# Patient Record
Sex: Female | Born: 1995
Health system: Southern US, Community
[De-identification: ages and names within clinical notes are randomized; demographics above are authoritative.]

## PROBLEM LIST (undated history)

## (undated) DIAGNOSIS — F32A Depression, unspecified: Secondary | ICD-10-CM

## (undated) DIAGNOSIS — F419 Anxiety disorder, unspecified: Secondary | ICD-10-CM

## (undated) DIAGNOSIS — A6000 Herpesviral infection of urogenital system, unspecified: Secondary | ICD-10-CM

## (undated) DIAGNOSIS — F329 Major depressive disorder, single episode, unspecified: Secondary | ICD-10-CM

## (undated) HISTORY — DX: Anxiety disorder, unspecified: F41.9

## (undated) HISTORY — DX: Depression, unspecified: F32.A

## (undated) HISTORY — DX: Herpesviral infection of urogenital system, unspecified: A60.00

## (undated) HISTORY — DX: Major depressive disorder, single episode, unspecified: F32.9

---
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
## 2015-05-04 ENCOUNTER — Encounter (HOSPITAL_COMMUNITY): Payer: Self-pay | Admitting: Emergency Medicine

## 2015-05-04 ENCOUNTER — Emergency Department (HOSPITAL_COMMUNITY)
Admission: EM | Admit: 2015-05-04 | Discharge: 2015-05-04 | Disposition: A | Discharge status: Home / Self Care | Payer: Commercial Managed Care - PPO | Attending: Emergency Medicine | Admitting: Emergency Medicine

## 2015-05-04 DIAGNOSIS — S0993XA Unspecified injury of face, initial encounter: Secondary | ICD-10-CM

## 2015-05-04 DIAGNOSIS — Y939 Activity, unspecified: Secondary | ICD-10-CM | POA: Insufficient documentation

## 2015-05-04 DIAGNOSIS — Z87891 Personal history of nicotine dependence: Secondary | ICD-10-CM | POA: Diagnosis not present

## 2015-05-04 DIAGNOSIS — Y999 Unspecified external cause status: Secondary | ICD-10-CM | POA: Insufficient documentation

## 2015-05-04 DIAGNOSIS — J45909 Unspecified asthma, uncomplicated: Secondary | ICD-10-CM | POA: Diagnosis not present

## 2015-05-04 DIAGNOSIS — F329 Major depressive disorder, single episode, unspecified: Secondary | ICD-10-CM | POA: Diagnosis not present

## 2015-05-04 DIAGNOSIS — Z79899 Other long term (current) drug therapy: Secondary | ICD-10-CM | POA: Insufficient documentation

## 2015-05-04 DIAGNOSIS — Y929 Unspecified place or not applicable: Secondary | ICD-10-CM | POA: Insufficient documentation

## 2015-05-04 MED ORDER — MORPHINE SULFATE (PF) 4 MG/ML IV SOLN
4.0000 mg | Freq: Once | INTRAVENOUS | Status: AC
  Administered 2015-05-04: 4 mg via INTRAVENOUS
  Filled 2015-05-04: qty 1

## 2015-05-04 MED ORDER — ONDANSETRON HCL 4 MG/2ML IJ SOLN
4.0000 mg | Freq: Once | INTRAMUSCULAR | Status: AC
  Administered 2015-05-04: 4 mg via INTRAVENOUS
  Filled 2015-05-04: qty 2

## 2015-05-04 MED ORDER — LORAZEPAM 2 MG/ML IJ SOLN
1.0000 mg | Freq: Once | INTRAMUSCULAR | Status: AC
  Administered 2015-05-04: 1 mg via INTRAVENOUS
  Filled 2015-05-04: qty 1

## 2015-05-04 MED ORDER — LIDOCAINE HCL (PF) 1 % IJ SOLN
INTRAMUSCULAR | Status: AC
  Administered 2015-05-04: 06:00:00
  Filled 2015-05-04: qty 5

## 2015-05-04 MED ORDER — CLINDAMYCIN HCL 300 MG PO CAPS: 300.0000 mg | ORAL_CAPSULE | Freq: Four times a day (QID) | ORAL | Status: DC

## 2015-05-04 MED ORDER — OXYCODONE-ACETAMINOPHEN 5-325 MG PO TABS
1.0000 | ORAL_TABLET | Freq: Three times a day (TID) | ORAL | Status: DC | PRN
Start: 2015-05-04 — End: 2023-08-02
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
## 2017-04-14 ENCOUNTER — Encounter (HOSPITAL_COMMUNITY): Payer: Self-pay | Admitting: Licensed Clinical Social Worker

## 2017-04-15 ENCOUNTER — Encounter (HOSPITAL_COMMUNITY): Payer: Self-pay | Admitting: Psychiatry

## 2017-04-15 ENCOUNTER — Ambulatory Visit (HOSPITAL_COMMUNITY): Payer: Commercial Managed Care - PPO | Admitting: Psychiatry

## 2017-04-15 DIAGNOSIS — F411 Generalized anxiety disorder: Secondary | ICD-10-CM

## 2017-04-15 DIAGNOSIS — F331 Major depressive disorder, recurrent, moderate: Secondary | ICD-10-CM
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
## 2017-05-04 ENCOUNTER — Ambulatory Visit (INDEPENDENT_AMBULATORY_CARE_PROVIDER_SITE_OTHER): Payer: Commercial Managed Care - PPO | Admitting: Licensed Clinical Social Worker

## 2017-05-04 ENCOUNTER — Encounter

## 2017-05-04 DIAGNOSIS — F411 Generalized anxiety disorder: Secondary | ICD-10-CM | POA: Diagnosis not present

## 2017-05-04 DIAGNOSIS — F1021 Alcohol dependence, in remission: Secondary | ICD-10-CM

## 2017-05-04 DIAGNOSIS — Z634 Disappearance and death of family member: Secondary | ICD-10-CM | POA: Diagnosis not present

## 2017-05-04 NOTE — Progress Notes (Signed)
   THERAPIST PROGRESS NOTE  Session Time: 4-5pm  Participation Level: Active  Behavioral Response: Bizarre and Well GroomedAlert and ConfusedEuthymic  Type of Therapy: Individual Therapy  Treatment Goals addressed: Anxiety  Interventions: CBT and Motivational Interviewing  Summary: Christie Smith is a 22 y.o. female who presents with panic sxs at work, GAD, and hx of AUD w/ 9 mo sobriety. She is engaged in session though she presents as somewhat detached, soft spoken, and silly. She reveals 15 min into session that her sister died suddenly on 05/02/2022 from complications due to alcohol and/or drugs. Pt states she intends to get help w/ controlling her panic sxs while at work. 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 When asked whether pt wants to make another appointment, pt hesitates and states she needs to "find out some more information about her insurance".   "I get really drained and overwhelmed by crowds. My thoughts go all over the place and it gets hard to breathe." "My sister just died a few weeks ago, of an apparent overdose." "My boss wants me to talk to therapist about whether or not I can do my job." "I've always had a lot of anxiety; I use to whisper to my mother and she would speak to others for me as a kid." "I laugh when I get nervous."   Suicidal/Homicidal: Nowithout intent/plan  Therapist Response: Counselor used open questions, reflection of emotion and content, supportive feedback, validation, and empathy. Counselor used minimal encourages to promote pt self disclosure though pt stated "she was not yet comfortable enough to go into her past trauma.   Plan: Return again TBD by pt.  Diagnosis:    ICD-10-CM   1. Generalized anxiety disorder F41.1   2. Alcohol use disorder, severe, in early remission [facility]) F10.21        Margo Common, LCAS-A 05/05/2017

## 2017-05-05 ENCOUNTER — Encounter (HOSPITAL_COMMUNITY): Payer: Self-pay | Admitting: Licensed Clinical Social Worker

## 2017-11-08 DIAGNOSIS — Z91018 Allergy to other foods: Secondary | ICD-10-CM | POA: Diagnosis not present

## 2017-11-08 DIAGNOSIS — Z124 Encounter for screening for malignant neoplasm of cervix: Secondary | ICD-10-CM | POA: Diagnosis not present

## 2017-11-08 DIAGNOSIS — Z0000 Encounter for general adult medical examination without abnormal findings: Secondary | ICD-10-CM | POA: Diagnosis not present

## 2017-11-08 DIAGNOSIS — Z13220 Encounter for screening for lipoid disorders: Secondary | ICD-10-CM | POA: Diagnosis not present

## 2018-02-08 ENCOUNTER — Ambulatory Visit: Payer: Commercial Managed Care - PPO | Admitting: Allergy & Immunology

## 2020-06-10 ENCOUNTER — Emergency Department (HOSPITAL_COMMUNITY): Payer: Managed Care, Other (non HMO)

## 2020-06-10 ENCOUNTER — Emergency Department (HOSPITAL_COMMUNITY)
Admission: EM | Admit: 2020-06-10 | Discharge: 2020-06-11 | Disposition: A | Discharge status: Home / Self Care | Payer: Managed Care, Other (non HMO) | Attending: Emergency Medicine | Admitting: Emergency Medicine

## 2020-06-10 ENCOUNTER — Ambulatory Visit (HOSPITAL_COMMUNITY)
Admission: EM | Admit: 2020-06-10 | Discharge: 2020-06-10 | Disposition: A | Discharge status: Home / Self Care | Payer: Managed Care, Other (non HMO)

## 2020-06-10 ENCOUNTER — Inpatient Hospital Stay (EMERGENCY_DEPARTMENT_HOSPITAL)
Admission: AD | Admit: 2020-06-10 | Discharge: 2020-06-10 | Disposition: A | Discharge status: Home / Self Care | Payer: Managed Care, Other (non HMO) | Source: Home / Self Care | Attending: Obstetrics and Gynecology | Admitting: Obstetrics and Gynecology

## 2020-06-10 ENCOUNTER — Encounter (HOSPITAL_COMMUNITY): Payer: Self-pay | Admitting: Emergency Medicine

## 2020-06-10 ENCOUNTER — Other Ambulatory Visit: Payer: Self-pay

## 2020-06-10 ENCOUNTER — Encounter (HOSPITAL_COMMUNITY): Payer: Self-pay

## 2020-06-10 DIAGNOSIS — R1031 Right lower quadrant pain: Secondary | ICD-10-CM | POA: Diagnosis not present

## 2020-06-10 DIAGNOSIS — J45909 Unspecified asthma, uncomplicated: Secondary | ICD-10-CM | POA: Diagnosis not present

## 2020-06-10 DIAGNOSIS — R110 Nausea: Secondary | ICD-10-CM | POA: Diagnosis not present

## 2020-06-10 DIAGNOSIS — N939 Abnormal uterine and vaginal bleeding, unspecified: Secondary | ICD-10-CM | POA: Diagnosis not present

## 2020-06-10 DIAGNOSIS — R1011 Right upper quadrant pain: Secondary | ICD-10-CM | POA: Insufficient documentation

## 2020-06-10 DIAGNOSIS — D72829 Elevated white blood cell count, unspecified: Secondary | ICD-10-CM | POA: Diagnosis not present

## 2020-06-10 DIAGNOSIS — Z3202 Encounter for pregnancy test, result negative: Secondary | ICD-10-CM | POA: Insufficient documentation

## 2020-06-10 DIAGNOSIS — Z87891 Personal history of nicotine dependence: Secondary | ICD-10-CM | POA: Diagnosis not present

## 2020-06-10 DIAGNOSIS — Z789 Other specified health status: Secondary | ICD-10-CM

## 2020-06-10 LAB — COMPREHENSIVE METABOLIC PANEL
ALT: 19 U/L (ref 0–44)
AST: 26 U/L (ref 15–41)
Albumin: 4.4 g/dL (ref 3.5–5.0)
Alkaline Phosphatase: 79 U/L (ref 38–126)
Anion gap: 11 (ref 5–15)
BUN: 9 mg/dL (ref 6–20)
CO2: 24 mmol/L (ref 22–32)
Calcium: 9.8 mg/dL (ref 8.9–10.3)
Chloride: 103 mmol/L (ref 98–111)
Creatinine, Ser: 0.9 mg/dL (ref 0.44–1.00)
GFR, Estimated: >60 mL/min (ref >60)
Glucose, Bld: 120 mg/dL — ABNORMAL HIGH (ref 70–99)
Potassium: 3.6 mmol/L (ref 3.5–5.1)
Sodium: 138 mmol/L (ref 135–145)
Total Bilirubin: 0.2 mg/dL — ABNORMAL LOW (ref 0.3–1.2)
Total Protein: 7.9 g/dL (ref 6.5–8.1)

## 2020-06-10 LAB — CBC WITH DIFFERENTIAL/PLATELET
Abs Immature Granulocytes: 0.05 10*3/uL (ref 0.00–0.07)
Basophils Absolute: 0 10*3/uL (ref 0.0–0.1)
Basophils Relative: 0 %
Eosinophils Absolute: 0 10*3/uL (ref 0.0–0.5)
Eosinophils Relative: 0 %
HCT: 42.6 % (ref 36.0–46.0)
Hemoglobin: 14.6 g/dL (ref 12.0–15.0)
Immature Granulocytes: 1 %
Lymphocytes Relative: 30 %
Lymphs Abs: 3.2 10*3/uL (ref 0.7–4.0)
MCH: 31.1 pg (ref 26.0–34.0)
MCHC: 34.3 g/dL (ref 30.0–36.0)
MCV: 90.8 fL (ref 80.0–100.0)
Monocytes Absolute: 0.7 10*3/uL (ref 0.1–1.0)
Monocytes Relative: 6 %
Neutro Abs: 6.8 10*3/uL (ref 1.7–7.7)
Neutrophils Relative %: 63 %
Platelets: 301 10*3/uL (ref 150–400)
RBC: 4.69 MIL/uL (ref 3.87–5.11)
RDW: 12.5 % (ref 11.5–15.5)
WBC: 10.8 10*3/uL — ABNORMAL HIGH (ref 4.0–10.5)
nRBC: 0 % (ref 0.0–0.2)

## 2020-06-10 LAB — URINALYSIS, ROUTINE W REFLEX MICROSCOPIC
Bilirubin Urine: NEGATIVE
Glucose, UA: NEGATIVE mg/dL
Hgb urine dipstick: NEGATIVE
Ketones, ur: NEGATIVE mg/dL
Leukocytes,Ua: NEGATIVE
Nitrite: NEGATIVE
Protein, ur: NEGATIVE mg/dL
Specific Gravity, Urine: 1.002 — ABNORMAL LOW (ref 1.005–1.030)
pH: 7 (ref 5.0–8.0)

## 2020-06-10 LAB — POCT PREGNANCY, URINE: Preg Test, Ur: NEGATIVE

## 2020-06-10 LAB — LIPASE, BLOOD: Lipase: 28 U/L (ref 11–51)

## 2020-06-10 NOTE — ED Provider Notes (Signed)
Emergency Medicine Provider Triage Evaluation Note  Christie Smith , a 25 y.o. female  was evaluated in triage.  Pt complains of ruq ABD PAIN x1 mo.  Review of Systems  Positive: RUQ pain Negative: Urinary sxs  Physical Exam  LMP 06/07/2020  Gen:   Awake, no distress   Resp:  Normal effort  MSK:   Moves extremities without difficulty  Other:  TTP RUQ  Medical Decision Making  Medically screening exam initiated at 5:15 PM.  Appropriate orders placed.  Christie Smith was informed that the remainder of the evaluation will be completed by another provider, this initial triage assessment does not replace that evaluation, and the importance of remaining in the ED until their evaluation is complete.  RUQ abd pain- work up started   Arthor Captain, PA-C 06/10/20 1721    Long, Arlyss Repress, MD 06/10/20 2014

## 2020-06-10 NOTE — MAU Provider Note (Signed)
     S Ms. Christie Smith is a 25 y.o. No obstetric history on file. non-pregnant female who presents to MAU today with complaint of vaginal bleeding. Period was about one week lat and she is now having vaginal bleeding. Did not take a pregnancy test at home but wondering if pregnant. UPT neg on arrival.   O BP (!) 144/93 (BP Location: Right Arm)   Pulse 96   Temp 99 F (37.2 C) (Oral)   Resp 16   SpO2 98% Comment: room air VS reviewed, nursing note reviewed,  Constitutional: well developed, well nourished, no distress HEENT: normocephalic CV: normal rate Pulm: normal effort Abdomen: soft, NT Neuro: alert and oriented x 3 Skin: warm, dry Psych: affect normal  A Non pregnant female Medical screening exam complete    P Discharge from MAU in stable condition Patient given the option of transfer to [facility] for further evaluation or seek care in outpatient facility of choice List of options for follow-up given  Warning signs for worsening condition that would warrant emergency follow-up discussed Patient may return to MAU as needed for pregnancy related complaints  Gita Kudo, MD 06/10/2020 2:33 PM

## 2020-06-10 NOTE — ED Notes (Signed)
Patient is being discharged from the Urgent Care and sent to the Emergency Department via POV . Per Salli Quarry, NP, patient is in need of higher level of care due to severe abdominal pain. Patient is aware and verbalizes understanding of plan of care.  Vitals:   06/10/20 1537  BP: 131/85  Pulse: 96  Resp: 19  Temp: 98.5 F (36.9 C)  SpO2: 100%

## 2020-06-10 NOTE — ED Triage Notes (Signed)
Pt is present today RLQ pain, constipation, and nasuea. Pt states that her pain started a month ago. Pt describes the pain as sharp.

## 2020-06-10 NOTE — ED Triage Notes (Signed)
Patient complains of right sided abdominal pain x 1 month, reports that the intensity changes, sent from MAU after negative pregnancy test. Patient reports nausea with same

## 2020-06-10 NOTE — MAU Note (Signed)
Christie Smith is a 25 y.o.here in MAU reporting: thinks she might be pregnant or miscarrying. Did not do a pregnancy test. Was supposed to start her period a little over a week ago but it didn't start. 2 days ago started having sharp mid abdominal pain and started bleeding and passing clots. States bleeding has slowed down but is still bright red. States it does not smell like period blood but like blood.   Onset of complaint: ongoing  Pain score: 4/10  Vitals:   06/10/20 1423  BP: (!) 144/93  Pulse: 96  Resp: 16  Temp: 99 F (37.2 C)  SpO2: 98%     Lab orders placed from triage: UPT

## 2020-06-10 NOTE — ED Provider Notes (Signed)
[facility]    CSN: [id number] Arrival date & time: 06/10/20  1440      History   Chief Complaint Chief Complaint  Patient presents with  . Abdominal Pain    HPI Christie Smith is a 25 y.o. female.   Patient presents with sharp RLQ abdominal pain, intermittent constipation LBM 06/08/20, nausea, fatigue, generalized weakness that began 1 month ago. Pain has been constant and persistent, worsened depending on sitting position. Initially thought this was muscle injury related to gym therefore she has altered fitness routine by incorporating more walking and staying hydrated with water. Menses was 1 week late this month, typically cycle is regular, began on the 6/4 instead of 5/29. Describes day 1 as heavy with lots of clots, bleeding has become lighter over the last two days. Went to [facility] today, pregnancy test negative. History of anxiety, taking Pristiq and Wellbutrin as prescribed. Had stress induced seizure in April,first seizure ever, was supposed to follow up with neurology but never went. Sister died 3 years ago in 05-12-22 and patient feels her mental health and then physical health began to decline around anniversary of death. Has been putting off being evaluated stating " I just don't want to die, that would be so hard on my family" . Denies fever, chills, chest pain, shortness of breath, diarrhea, visual changes, speech changes, memory loss, dizziness, lightheadedness.   Past Medical History:  Diagnosis Date  . Anxiety   . Asthma   . Depression     There are no problems to display for this patient.   History reviewed. No pertinent surgical history.  OB History   No obstetric history on file.      Home Medications    Prior to Admission medications   Medication Sig Start Date End Date Taking? Authorizing Provider  buPROPion (WELLBUTRIN XL) 150 MG 24 hr tablet Take 1 tablet by mouth every morning. 04/16/20   [provider]  clindamycin (CLEOCIN) 300 MG  capsule Take 1 capsule (300 mg total) by mouth 4 (four) times daily. X 7 days 05/04/15   Zadie Rhine, MD  desvenlafaxine (PRISTIQ) 100 MG 24 hr tablet Take 100 mg by mouth daily. 05/27/20   [provider]  naproxen sodium (ANAPROX) 220 MG tablet Take 440 mg by mouth 2 (two) times daily as needed (for pain.).    [provider]  oxyCODONE-acetaminophen (PERCOCET/ROXICET) 5-325 MG tablet Take 1 tablet by mouth every 8 (eight) hours as needed for severe pain. 05/04/15   Zadie Rhine, MD    Family History History reviewed. No pertinent family history.  Social History Social History   Tobacco Use  . Smoking status: Former Smoker    Quit date: 08/10/2010    Years since quitting: 9.8  Substance Use Topics  . Alcohol use: No  . Drug use: No     Allergies   Amoxicillin-pot clavulanate   Review of Systems Review of Systems   Physical Exam Triage Vital Signs ED Triage Vitals  Enc Vitals Group     BP 06/10/20 1537 131/85     Pulse Rate 06/10/20 1537 96     Resp 06/10/20 1537 19     Temp 06/10/20 1537 98.5 F (36.9 C)     Temp Source 06/10/20 1537 Oral     SpO2 06/10/20 1537 100 %     Weight --      Height --      Head Circumference --      Peak Flow --  Pain Score 06/10/20 1540 5     Pain Loc --      Pain Edu? --      Excl. in GC? --    No data found.  Updated Vital Signs BP 131/85 (BP Location: Right Arm)   Pulse 96   Temp 98.5 F (36.9 C) (Oral)   Resp 19   LMP 06/07/2020   SpO2 100%   Visual Acuity Right Eye Distance:   Left Eye Distance:   Bilateral Distance:    Right Eye Near:   Left Eye Near:    Bilateral Near:     Physical Exam   UC Treatments / Results  Labs (all labs ordered are listed, but only abnormal results are displayed) Labs Reviewed - No data to display  EKG   Radiology No results found.  Procedures Procedures (including critical care time)  Medications Ordered in UC Medications - No data to  display  Initial Impression / Assessment and Plan / UC Course  I have reviewed the triage vital signs and the nursing notes.  Pertinent labs & imaging results that were available during my care of the patient were reviewed by me and considered in my medical decision making (see chart for details).  RLQ abdominal pain  Sent to emergency department for further evaluation, possibly need higher lever of imaging, on exam palpation of RLQ elicited severe pain causing patient to jerk body, guard then became tearful. Vital signs stable at this time. To be escorted to emergency department by father.  Final Clinical Impressions(s) / UC Diagnoses   Final diagnoses:  None   Discharge Instructions   None    ED Prescriptions    None     PDMP not reviewed this encounter.   Valinda Hoar, [place] 06/10/20 [phone]

## 2020-06-11 MED ORDER — POLYETHYLENE GLYCOL 3350 17 G PO PACK: 17.0000 g | PACK | Freq: Every day | ORAL | 0 refills | Status: DC

## 2020-06-11 MED ORDER — OMEPRAZOLE 20 MG PO CPDR: 20.0000 mg | DELAYED_RELEASE_CAPSULE | Freq: Every day | ORAL | 0 refills | Status: DC

## 2020-06-11 NOTE — ED Provider Notes (Signed)
[facility] EMERGENCY DEPARTMENT Provider Note   CSN: [id number] Arrival date & time: 06/10/20  1609     History No chief complaint on file.   Christie Smith is a 25 y.o. female presents to the Emergency Department complaining of gradual, persistent right upper quadrant and right-sided abdominal pain onset approximately 1 month ago.  Patient reports it is worse with movement and worse after eating a large meal.  It is associated with nausea but she has not had any vomiting.  No treatments prior to arrival.  No previous abdominal surgeries.  Patient reports she was concerned that she might be pregnant and has had some vaginal bleeding with intercourse but recently missed her period.  No other specific aggravating or alleviating factors.  Deep breaths do not make this worse.  No chest pain or shortness of breath.  No recent travel or sick contacts.  No diarrhea.  Reports bowel movements have been hard over the last several weeks but she denies melena or hematochezia.  No treatment for constipation.  The history is provided by the patient and medical records. No language interpreter was used.       Past Medical History:  Diagnosis Date  . Anxiety   . Asthma   . Depression     There are no problems to display for this patient.   History reviewed. No pertinent surgical history.   OB History   No obstetric history on file.     No family history on file.  Social History   Tobacco Use  . Smoking status: Former Smoker    Quit date: 08/10/2010    Years since quitting: 9.8  Substance Use Topics  . Alcohol use: No  . Drug use: No    Home Medications Prior to Admission medications   Medication Sig Start Date End Date Taking? Authorizing Provider  buPROPion (WELLBUTRIN XL) 150 MG 24 hr tablet Take 1 tablet by mouth every morning. 04/16/20  Yes [provider]  desvenlafaxine (PRISTIQ) 100 MG 24 hr tablet Take 100 mg by mouth daily. 05/27/20  Yes [provider]  lamoTRIgine (LAMICTAL) 25 MG tablet Take 50 mg by mouth daily. 04/16/20  Yes [provider]  naproxen sodium (ANAPROX) 220 MG tablet Take 440 mg by mouth 2 (two) times daily as needed (for pain.).   Yes [provider]  clindamycin (CLEOCIN) 300 MG capsule Take 1 capsule (300 mg total) by mouth 4 (four) times daily. X 7 days Patient not taking: No sig reported 05/04/15   Zadie Rhine, MD  oxyCODONE-acetaminophen (PERCOCET/ROXICET) 5-325 MG tablet Take 1 tablet by mouth every 8 (eight) hours as needed for severe pain. Patient not taking: No sig reported 05/04/15   Zadie Rhine, MD    Allergies    Amoxicillin-pot clavulanate  Review of Systems   Review of Systems  Constitutional: Negative for appetite change, diaphoresis, fatigue, fever and unexpected weight change.  HENT: Negative for mouth sores and trouble swallowing.   Respiratory: Negative for cough, chest tightness, shortness of breath, wheezing and stridor.   Cardiovascular: Negative for chest pain and palpitations.  Gastrointestinal: Positive for abdominal pain and nausea. Negative for abdominal distention, blood in stool, constipation, diarrhea, rectal pain and vomiting.  Genitourinary: Negative for difficulty urinating, dysuria, flank pain, frequency, hematuria and urgency.  Musculoskeletal: Negative for back pain, neck pain and neck stiffness.  Skin: Negative for rash.  Neurological: Negative for weakness.  Hematological: Negative for adenopathy.  Psychiatric/Behavioral: Negative for confusion.  All other systems reviewed and are negative.   Physical Exam Updated Vital Signs BP 139/73 (BP Location: Right Arm)   Pulse 79   Temp 98.3 F (36.8 C) (Oral)   Resp 16   LMP 06/07/2020   SpO2 99%   Physical Exam Vitals and nursing note reviewed.  Constitutional:      General: She is not in acute distress.    Appearance: She is not diaphoretic.  HENT:     Head: Normocephalic.  Eyes:      General: No scleral icterus.    Conjunctiva/sclera: Conjunctivae normal.  Cardiovascular:     Rate and Rhythm: Normal rate and regular rhythm.     Pulses: Normal pulses.          Radial pulses are 2+ on the right side and 2+ on the left side.  Pulmonary:     Effort: No tachypnea, accessory muscle usage, prolonged expiration, respiratory distress or retractions.     Breath sounds: No stridor.     Comments: Equal chest rise. No increased work of breathing. Abdominal:     General: There is no distension.     Palpations: Abdomen is soft.     Tenderness: There is abdominal tenderness in the right upper quadrant. There is no right CVA tenderness, left CVA tenderness, guarding or rebound.    Musculoskeletal:     Cervical back: Normal range of motion.     Comments: Moves all extremities equally and without difficulty.  Skin:    General: Skin is warm and dry.     Capillary Refill: Capillary refill takes less than 2 seconds.  Neurological:     Mental Status: She is alert.     GCS: GCS eye subscore is 4. GCS verbal subscore is 5. GCS motor subscore is 6.     Comments: Speech is clear and goal oriented.  Psychiatric:        Mood and Affect: Mood normal.     ED Results / Procedures / Treatments   Labs (all labs ordered are listed, but only abnormal results are displayed) Labs Reviewed  CBC WITH DIFFERENTIAL/PLATELET - Abnormal; Notable for the following components:      Result Value   WBC 10.8 (*)    All other components within normal limits  COMPREHENSIVE METABOLIC PANEL - Abnormal; Notable for the following components:   Glucose, Bld 120 (*)    Total Bilirubin 0.2 (*)    All other components within normal limits  URINALYSIS, ROUTINE W REFLEX MICROSCOPIC - Abnormal; Notable for the following components:   Color, Urine COLORLESS (*)    Specific Gravity, Urine 1.002 (*)    All other components within normal limits  LIPASE, BLOOD  PREGNANCY, URINE    Radiology CT Abdomen  Pelvis Wo Contrast  Result Date: 06/10/2020 CLINICAL DATA:  Right lower quadrant abdominal pain EXAM: CT ABDOMEN AND PELVIS WITHOUT CONTRAST TECHNIQUE: Multidetector CT imaging of the abdomen and pelvis was performed following the standard protocol without IV contrast. COMPARISON:  None. FINDINGS: Lower chest: No acute abnormality. Hepatobiliary: No focal liver abnormality is seen. No gallstones, gallbladder wall thickening, or biliary dilatation. Pancreas: Unremarkable Spleen: Unremarkable Adrenals/Urinary Tract: Adrenal glands are unremarkable. Kidneys are normal, without renal calculi, focal lesion, or hydronephrosis. Bladder is unremarkable. Stomach/Bowel: Stomach is within normal limits. Appendix appears normal. No evidence of bowel wall thickening, distention, or inflammatory changes. No free intraperitoneal gas or fluid. Vascular/Lymphatic: No significant vascular findings are present. No enlarged abdominal or pelvic lymph nodes. Reproductive:  Uterus and bilateral adnexa are unremarkable. Other: No abdominal wall hernia.  Rectum unremarkable. Musculoskeletal: No acute bone abnormality. No lytic or blastic bone lesion. IMPRESSION: No acute intra-abdominal pathology identified.  Normal examination. Electronically Signed   By: Helyn Numbers MD   On: 06/10/2020 21:00   US ABDOMEN LIMITED RUQ (LIVER/GB)  Result Date: 06/10/2020 CLINICAL DATA:  Right upper quadrant pain x1 month EXAM: ULTRASOUND ABDOMEN LIMITED RIGHT UPPER QUADRANT COMPARISON:  None. FINDINGS: Gallbladder: No gallstones or wall thickening visualized. No sonographic Murphy sign noted by sonographer. Common bile duct: Diameter: 4 mm Liver: At the upper limits of normal for parenchymal echogenicity. No focal hepatic lesion is seen. Portal vein is patent on color Doppler imaging with normal direction of blood flow towards the liver. Other: None. IMPRESSION: Negative right upper quadrant ultrasound. Electronically Signed   By: Charline Bills  M.D.   On: 06/10/2020 18:07    Procedures Procedures   Medications Ordered in ED Medications - No data to display  ED Course  I have reviewed the triage vital signs and the nursing notes.  Pertinent labs & imaging results that were available during my care of the patient were reviewed by me and considered in my medical decision making (see chart for details).    MDM Rules/Calculators/A&P                           Patient presents with 1 month of right-sided abdominal pain.  Associated nausea but no vomiting.  No fevers or chills.  No recent travel.  Work-up here is reassuring.  Mild leukocytosis is noted but no signs of sirs or sepsis.  Afebrile.  Patient tolerating p.o. without difficulty.  CT scan without acute abnormality including no evidence of colitis, cholecystitis, diverticulitis, appendicitis.  Dedicated right upper quadrant ultrasound with normal gallbladder and no evidence of gallstones.  No elevation in lipase.  Discussed recent diet changes.  Possibility of gastritis.  Will start omeprazole.  Also suspect some element of constipation.  Will start MiraLAX.  Patient is to follow-up with GI as directed.  Of note, no pregnancy test was ordered in triage prior to CT scan.  No obvious pregnancy on CT.  Patient does not want to wait for pregnancy test here in the emergency department.   Final Clinical Impression(s) / ED Diagnoses Final diagnoses:  RUQ abdominal pain    Rx / DC Orders ED Discharge Orders    None       Monterio Bob, Boyd Kerbs 06/11/20 0244    Melene Plan, DO 06/11/20 [phone]

## 2020-06-11 NOTE — ED Notes (Signed)
Pt requesting to leave. This RN notified Dahlia Client PA. Pt to be DC

## 2020-06-11 NOTE — Discharge Instructions (Addendum)
1. Medications: omeprazole, miralax, usual home medications 2. Treatment: rest, drink plenty of fluids,  3. Follow Up: Please followup with neurology in 7-10 days for discussion of your diagnoses and further evaluation after today's visit; if you do not have a primary care doctor use the resource guide provided to find one; Please return to the ER for new or worsening

## 2020-06-11 NOTE — ED Notes (Signed)
Patient verbalizes understanding of discharge instructions. Opportunity for questioning and answers were provided. Armband removed by staff, pt discharged from ED ambulatory.   

## 2020-06-12 ENCOUNTER — Encounter: Payer: Self-pay | Admitting: Physician Assistant

## 2020-07-12 ENCOUNTER — Ambulatory Visit: Payer: Managed Care, Other (non HMO) | Admitting: Physician Assistant

## 2020-09-18 ENCOUNTER — Ambulatory Visit: Payer: Self-pay | Admitting: Diagnostic Neuroimaging

## 2021-10-11 IMAGING — CT CT ABD-PELV W/O CM
2 of 4 series · 17 of 46 positions shown, 19 images · non-contrast
Comparison: None.

CLINICAL DATA: Right lower quadrant abdominal pain

EXAM:
CT ABDOMEN AND PELVIS WITHOUT CONTRAST
TECHNIQUE: Multidetector CT imaging of the abdomen and pelvis was performed
following the standard protocol without IV contrast.

[Series 3: a/p w/o 5mm · axial · non-contrast · 0.94mm/px · z∈[+795,+1195]mm · 14 of 88 slices shown, 16 images]
[im 4/88  soft-tissue]
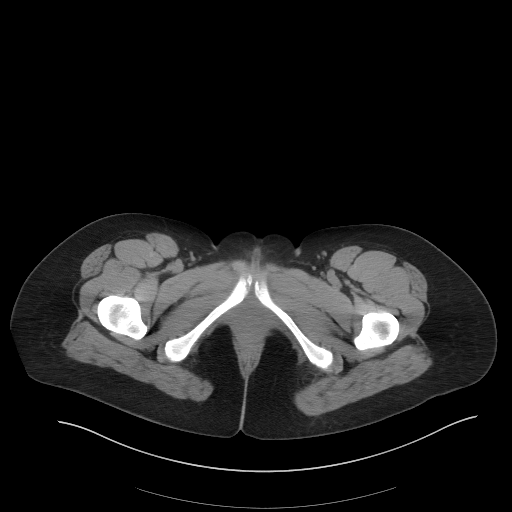
[im 4/88  bone]
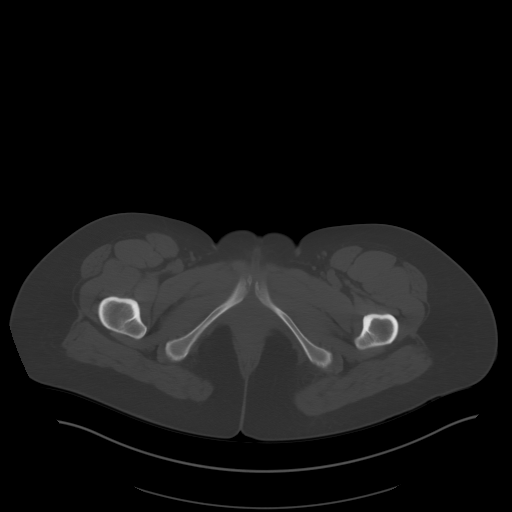
[im 11/88  soft-tissue]
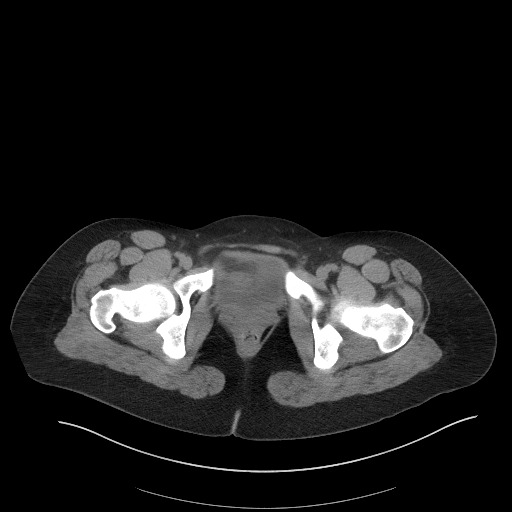
[im 19/88  soft-tissue]
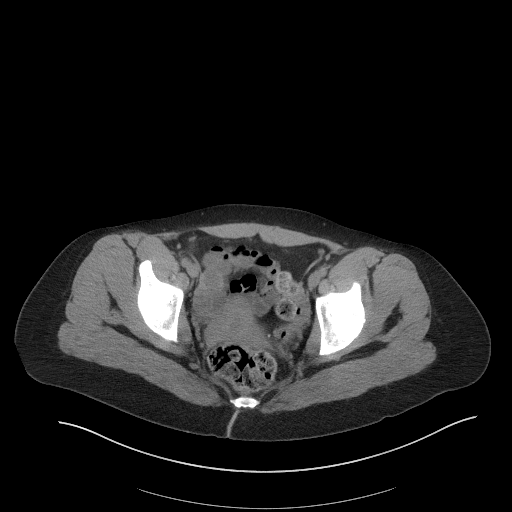
[im 22/88  soft-tissue]
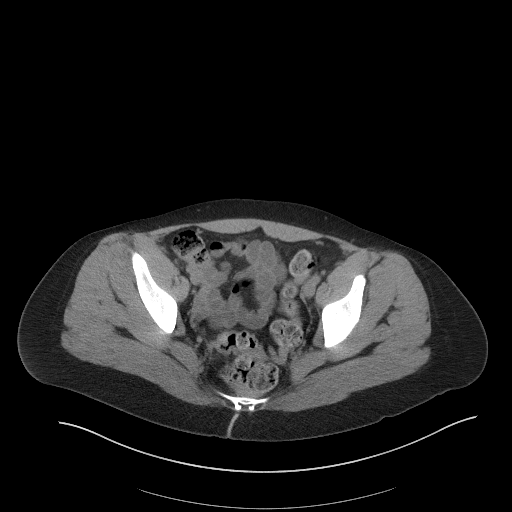
[im 30/88  soft-tissue]
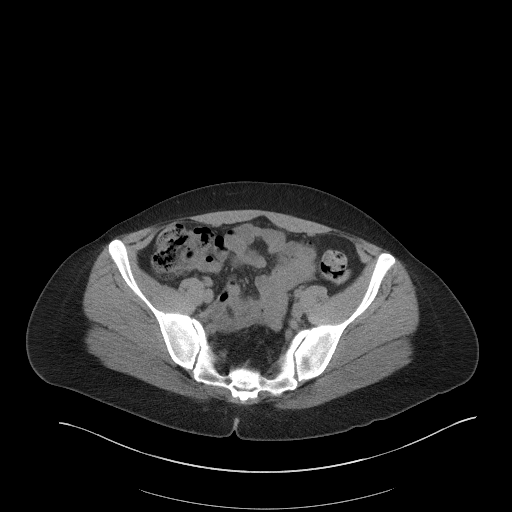
[im 37/88  soft-tissue]
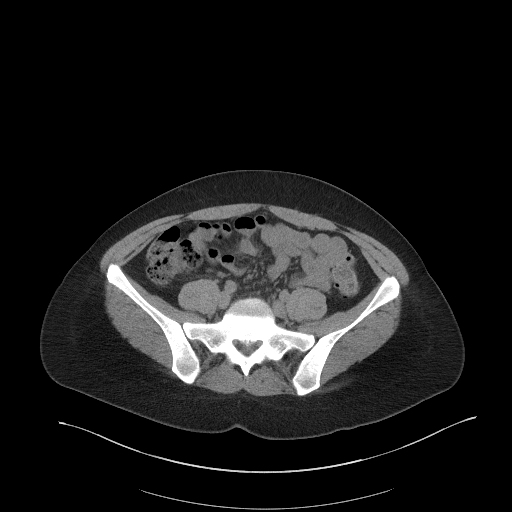
[im 40/88  soft-tissue]
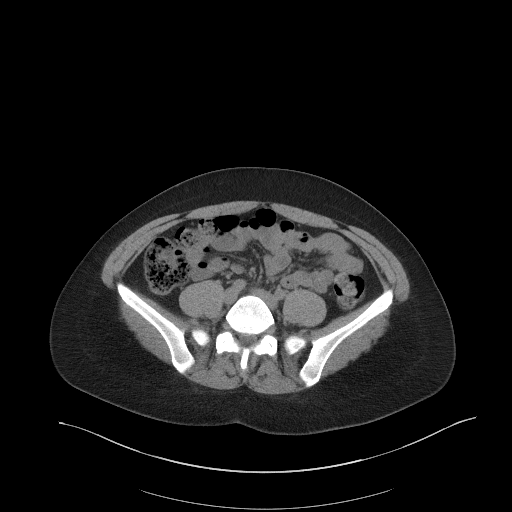
[im 48/88  soft-tissue]
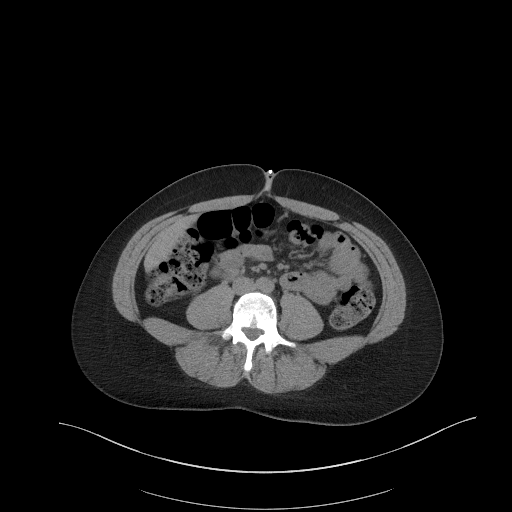
[im 51/88  soft-tissue]
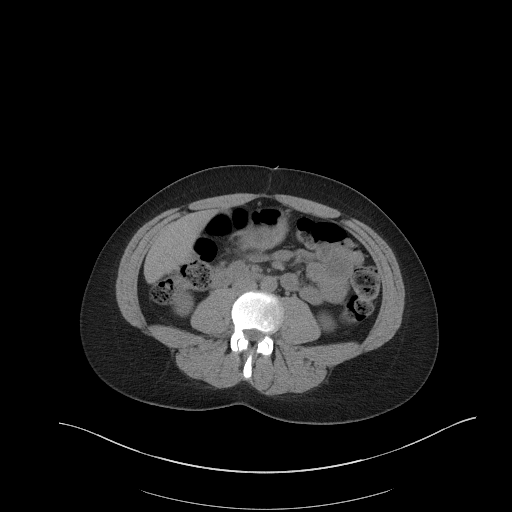
[im 51/88  bone]
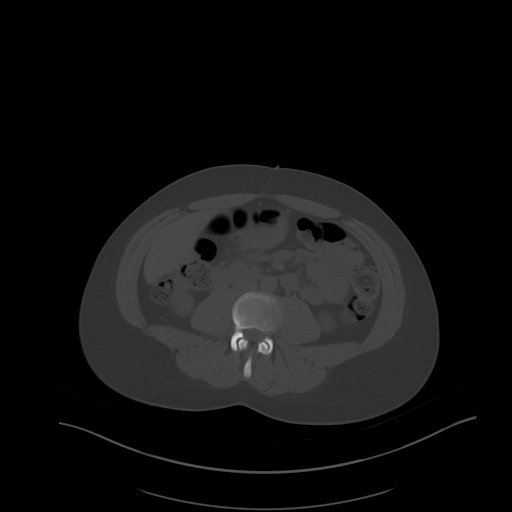
[im 59/88  soft-tissue]
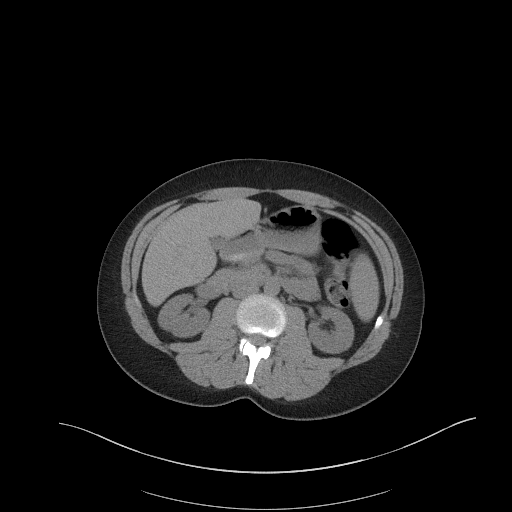
[im 66/88  soft-tissue]
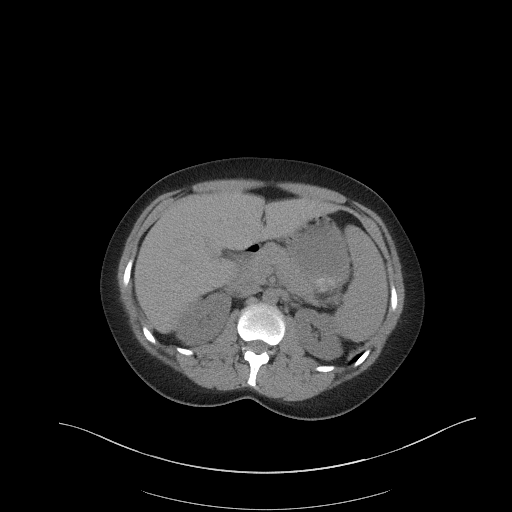
[im 69/88  soft-tissue]
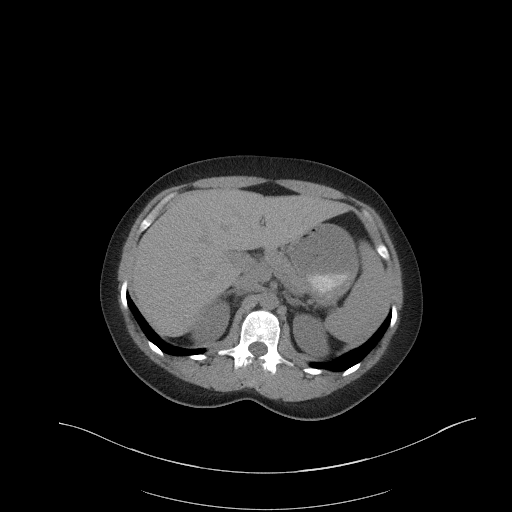
[im 77/88  soft-tissue]
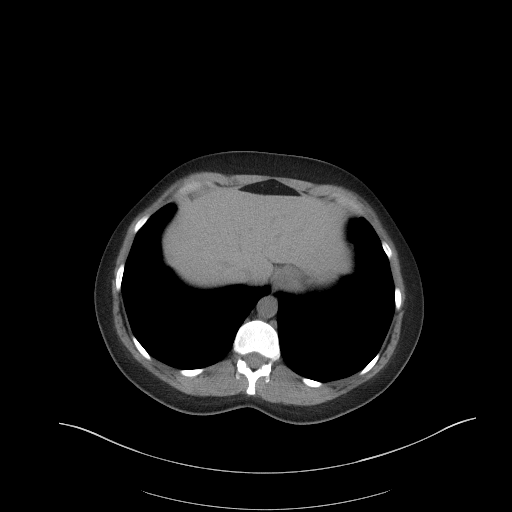
[im 84/88  soft-tissue]
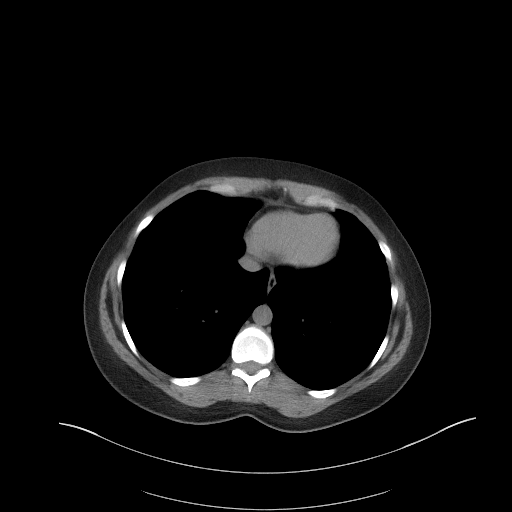

[Series 6: a/p w/o cor · coronal · non-contrast · 0.77mm/px · 3 of 151 slices shown]
[im 51/151  soft-tissue]
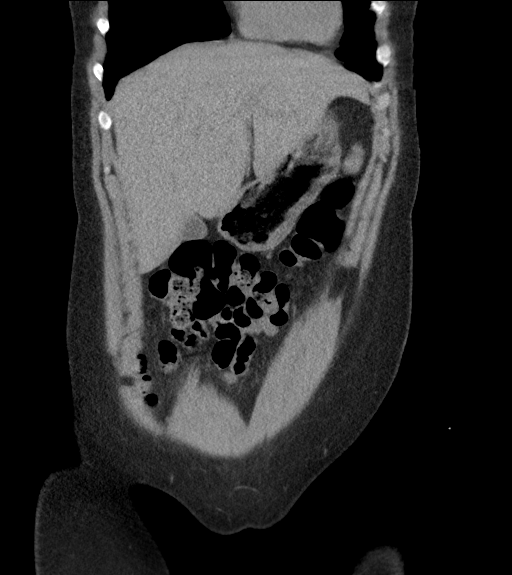
[im 67/151  soft-tissue]
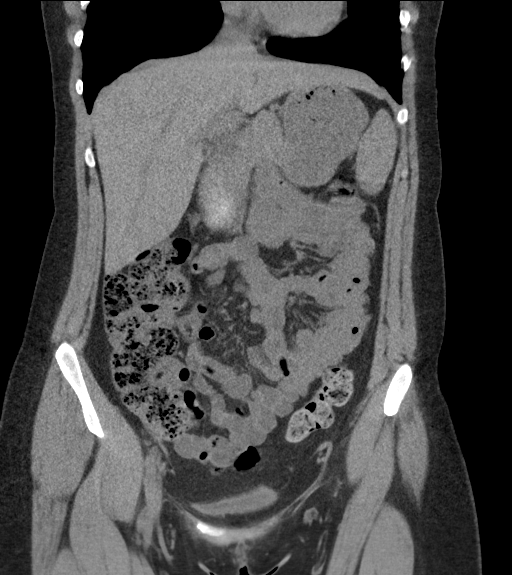
[im 84/151  soft-tissue]
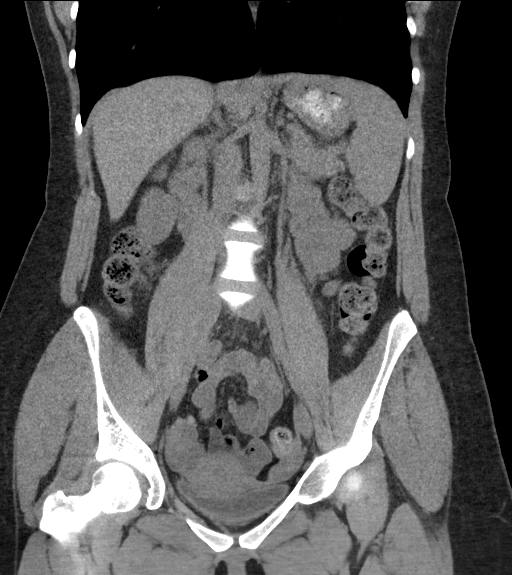

[17 of 46 positions shown; findings below may reference images not displayed]

FINDINGS: Lower chest: No acute abnormality.

Hepatobiliary: No focal liver abnormality is seen. No gallstones,
gallbladder wall thickening, or biliary dilatation.

Pancreas: Unremarkable

Spleen: Unremarkable

Adrenals/Urinary Tract: Adrenal glands are unremarkable. Kidneys are
normal, without renal calculi, focal lesion, or hydronephrosis.
Bladder is unremarkable.

Stomach/Bowel: Stomach is within normal limits. Appendix appears
normal. No evidence of bowel wall thickening, distention, or
inflammatory changes. No free intraperitoneal gas or fluid.

Vascular/Lymphatic: No significant vascular findings are present. No
enlarged abdominal or pelvic lymph nodes.

Reproductive: Uterus and bilateral adnexa are unremarkable.

Other: No abdominal wall hernia.  Rectum unremarkable.

Musculoskeletal: No acute bone abnormality. No lytic or blastic bone
lesion.
IMPRESSION: No acute intra-abdominal pathology identified.  Normal examination.

## 2021-10-11 IMAGING — US US ABDOMEN LIMITED
1 series · 14 of 25 positions shown · non-contrast
Comparison: None.

CLINICAL DATA: Right upper quadrant pain x1 month

EXAM:
ULTRASOUND ABDOMEN LIMITED RIGHT UPPER QUADRANT

[Series 1: us abdomen limited ruq (liver/gb) · 14 of 54 slices shown]
[im 1/54]
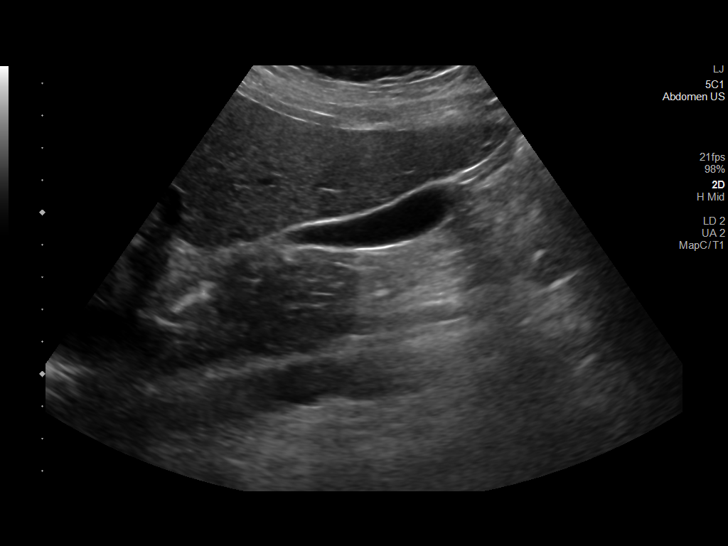
[im 5/54]
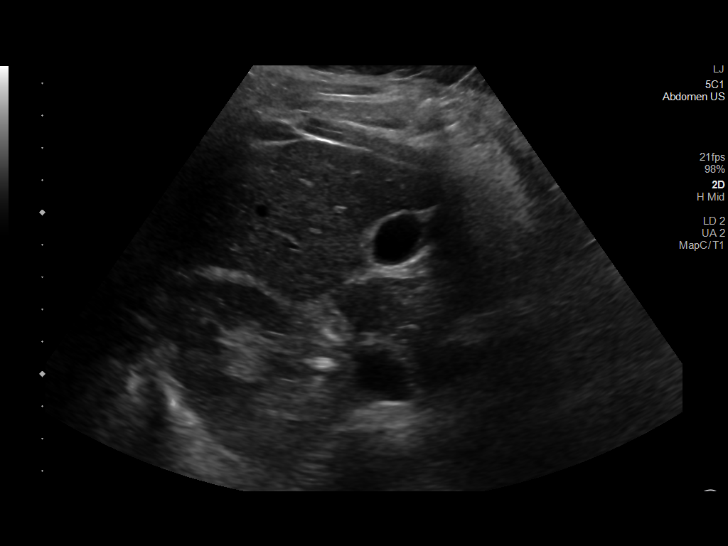
[im 9/54]
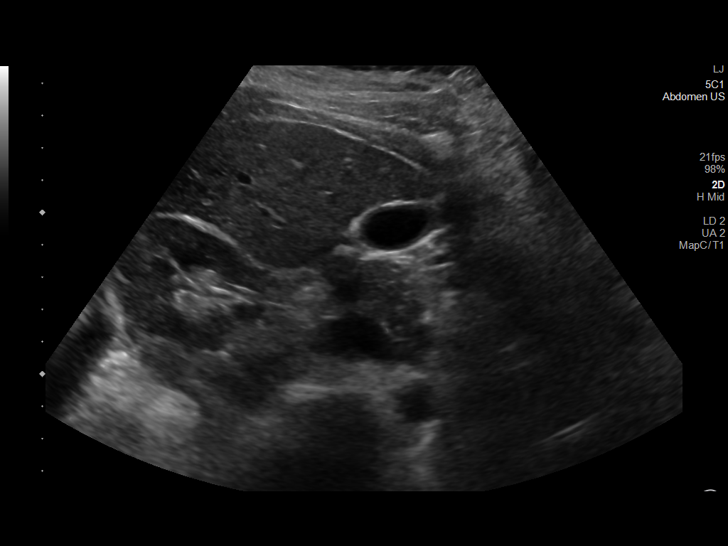
[im 14/54]
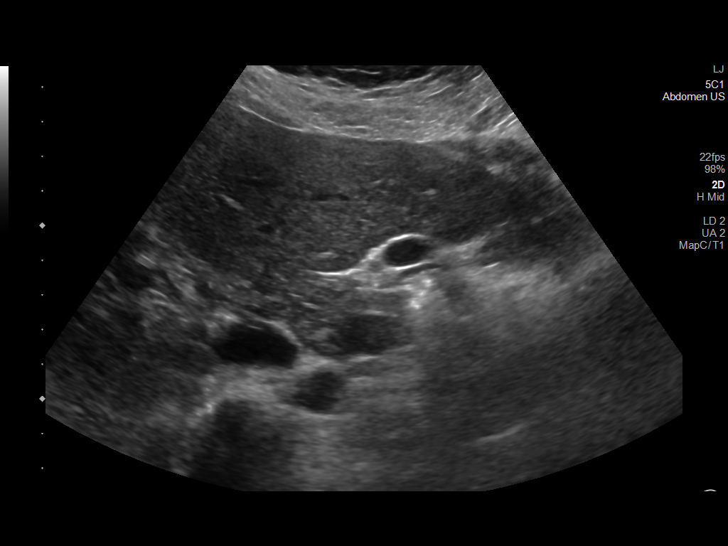
[im 18/54]
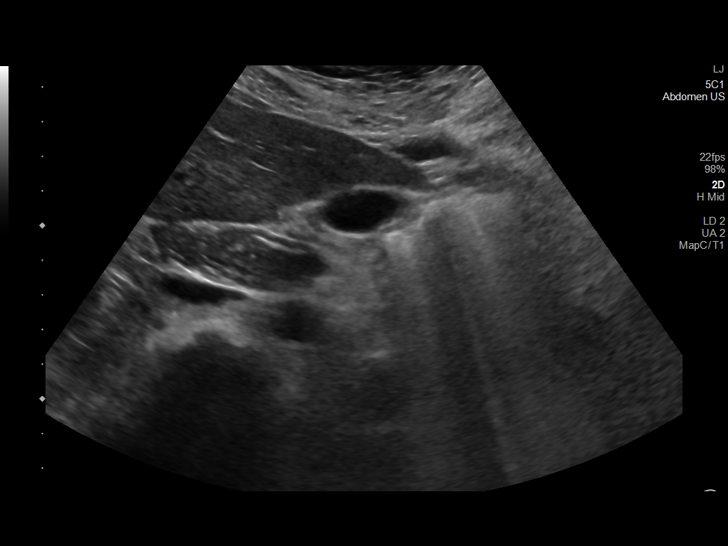
[im 20/54]
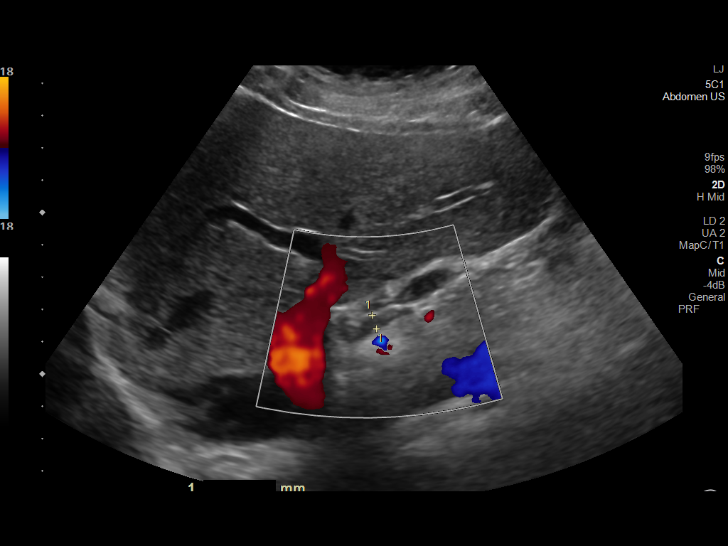
[im 25/54]
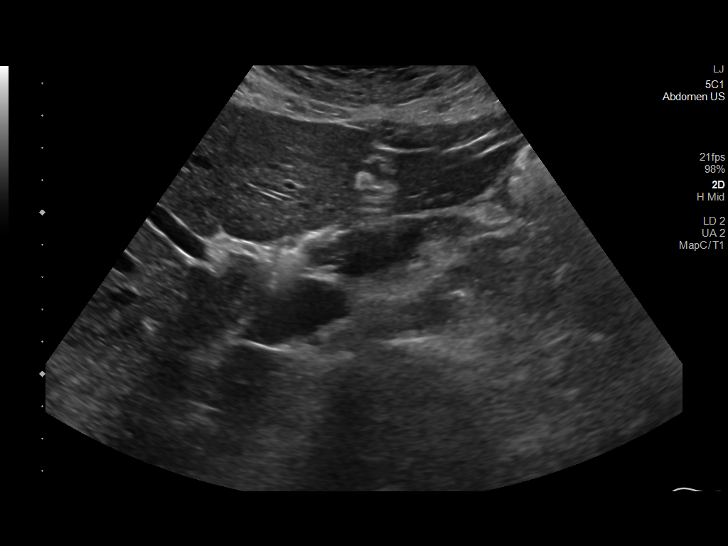
[im 29/54]
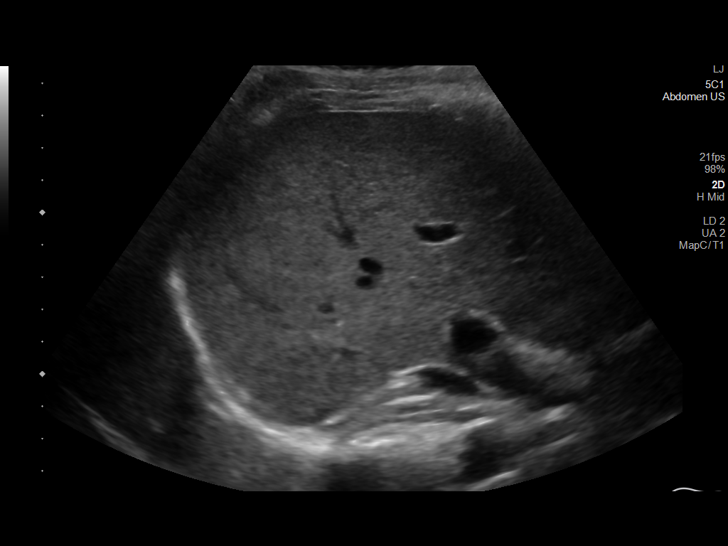
[im 34/54]
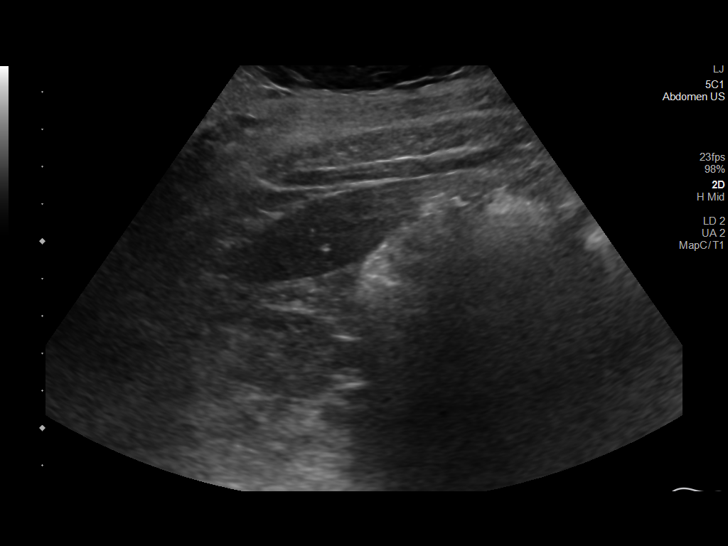
[im 36/54]
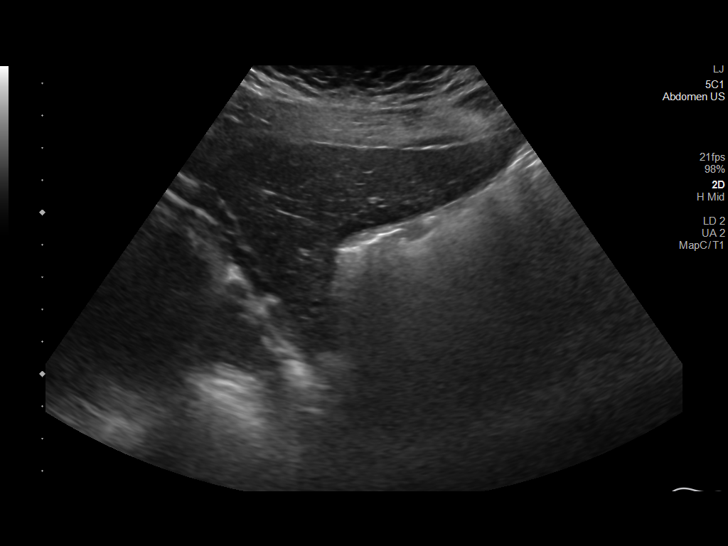
[im 40/54]
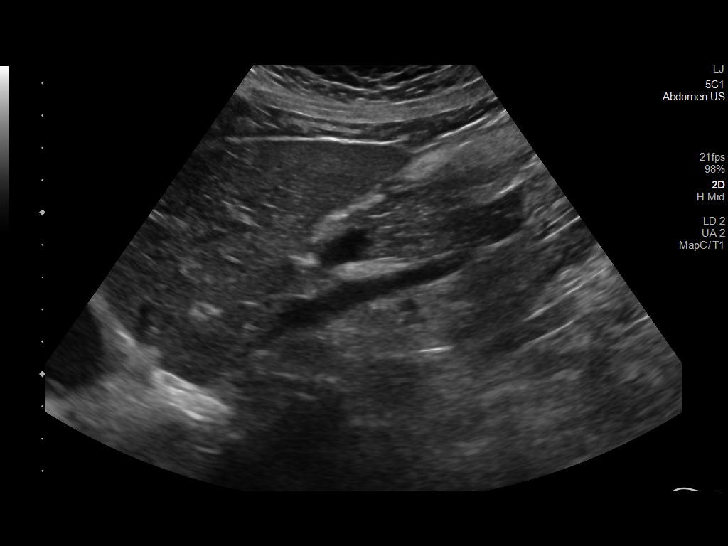
[im 45/54]
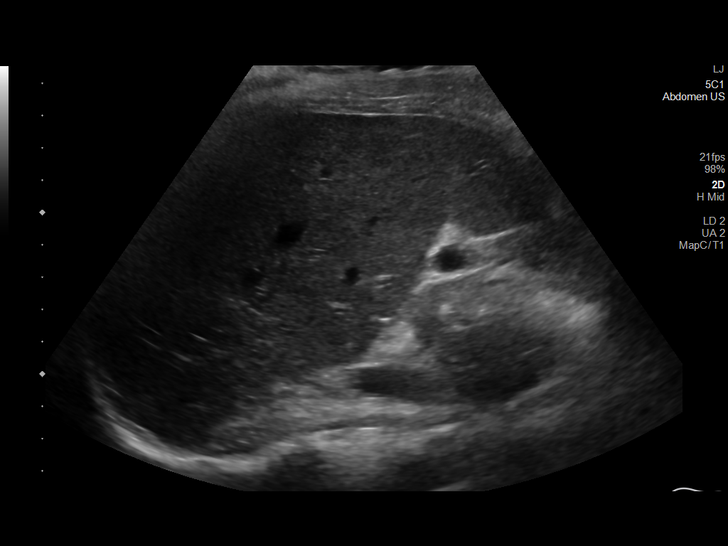
[im 49/54]
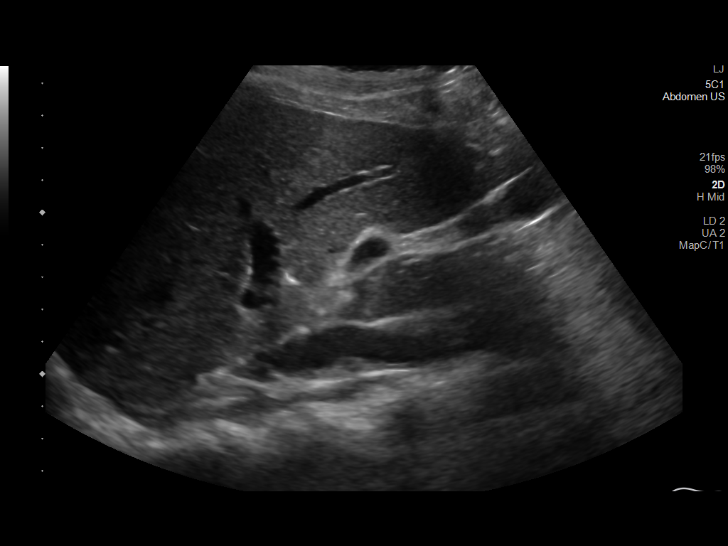
[im 54/54]
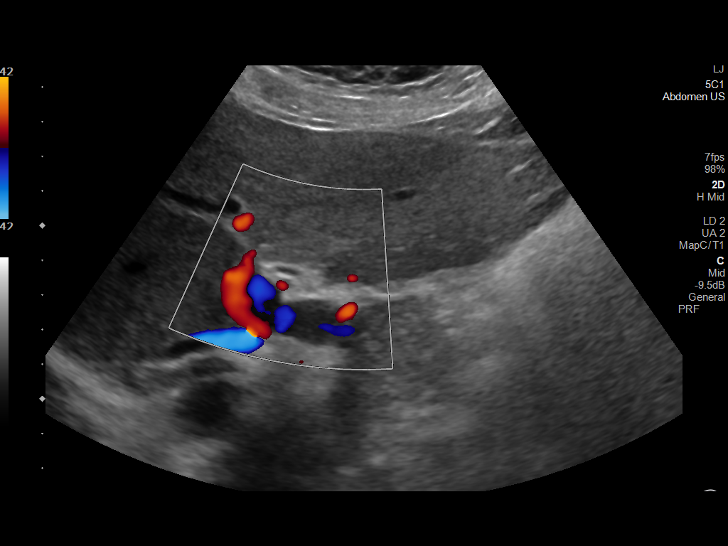

[14 of 25 positions shown; findings below may reference images not displayed]

FINDINGS: Gallbladder:

No gallstones or wall thickening visualized. No sonographic Murphy
sign noted by sonographer.

Common bile duct:

Diameter: 4 mm

Liver:

At the upper limits of normal for parenchymal echogenicity. No focal
hepatic lesion is seen. Portal vein is patent on color Doppler
imaging with normal direction of blood flow towards the liver.

Other: None.
IMPRESSION: Negative right upper quadrant ultrasound.

## 2023-08-02 ENCOUNTER — Encounter: Payer: Self-pay | Admitting: Internal Medicine

## 2023-08-02 ENCOUNTER — Ambulatory Visit (INDEPENDENT_AMBULATORY_CARE_PROVIDER_SITE_OTHER): Admitting: Internal Medicine

## 2023-08-02 VITALS — BP 106/72 | HR 83 | Temp 97.5°F | Ht <= 58 in | Wt 208.0 lb

## 2023-08-02 DIAGNOSIS — F32A Depression, unspecified: Secondary | ICD-10-CM | POA: Insufficient documentation

## 2023-08-02 DIAGNOSIS — N390 Urinary tract infection, site not specified: Secondary | ICD-10-CM | POA: Diagnosis not present

## 2023-08-02 DIAGNOSIS — F419 Anxiety disorder, unspecified: Secondary | ICD-10-CM | POA: Diagnosis not present

## 2023-08-02 DIAGNOSIS — Z0000 Encounter for general adult medical examination without abnormal findings: Secondary | ICD-10-CM | POA: Diagnosis not present

## 2023-08-02 DIAGNOSIS — Z0001 Encounter for general adult medical examination with abnormal findings: Secondary | ICD-10-CM | POA: Insufficient documentation

## 2023-08-02 DIAGNOSIS — J45909 Unspecified asthma, uncomplicated: Secondary | ICD-10-CM | POA: Insufficient documentation

## 2023-08-02 DIAGNOSIS — A6000 Herpesviral infection of urogenital system, unspecified: Secondary | ICD-10-CM | POA: Insufficient documentation

## 2023-08-02 MED ORDER — SULFAMETHOXAZOLE-TRIMETHOPRIM 800-160 MG PO TABS: 1.0000 | ORAL_TABLET | Freq: Every day | ORAL | 1 refills | Status: AC | PRN

## 2023-08-02 MED ORDER — VALACYCLOVIR HCL 500 MG PO TABS: 500.0000 mg | ORAL_TABLET | Freq: Every day | ORAL | 3 refills | Status: AC

## 2023-08-02 NOTE — Progress Notes (Signed)
 Patient ID: Christie Smith, female   DOB: 12/04/1995, 28 y.o.   MRN: [id number]         Chief Complaint:: wellness exam and New Patient (Initial Visit) (Discuss frequency in UTI's and having on hand abx ... Discuss Urology referral )  , anxiety possible bipolar       HPI:  Christie Smith is a 28 y.o. female here for wellness exam; declines hpv or tdap, o/w up to date                        Also Pt denies chest pain, increased sob or doe, wheezing, orthopnea, PND, increased LE swelling, palpitations, dizziness or syncope.   Pt denies polydipsia, polyuria, or new focal neuro s/s.    Pt denies fever, wt loss, night sweats, loss of appetite, or other constitutional symptoms  Denies worsening depressive symptoms, suicidal ideation, or panic; has ongoing anxiety and possible bipolar illness now on recent start lamictal, seroquel.  Also had recent outbreak genital herpes now resolved, asking for suppressive tx.  Also with more frequent intercourse over the past yr with multiple UTIs.  Just had recent labs at [facility] clinic and will upload results on mychart.   Wt Readings from Last 3 Encounters:  08/02/23 208 lb (94.3 kg)   BP Readings from Last 3 Encounters:  08/02/23 106/72  06/11/20 139/73  06/10/20 131/85   There is no immunization history on file for this patient. Health Maintenance Due  Topic Date Due   HIV Screening  Never done   Hepatitis C Screening  Never done   DTaP/Tdap/Td (1 - Tdap) Never done   Pneumococcal Vaccine 65-41 Years old (1 of 2 - PCV) Never done   Cervical Cancer Screening (Pap smear)  Never done   HPV VACCINES (1 - 3-dose SCDM series) Never done      Past Medical History:  Diagnosis Date   Anxiety    Asthma    Depression    Genital herpes    History reviewed. No pertinent surgical history.  reports that she quit smoking about 12 years ago. Her smoking use included cigarettes. She does not have any smokeless tobacco history on file. She reports that she does not  drink alcohol and does not use drugs. family history includes Bipolar disorder in her brother, mother, and sister. Allergies  Allergen Reactions   Amoxicillin-Pot Clavulanate Nausea And Vomiting and Rash    Has patient had a PCN reaction causing immediate rash, facial/tongue/throat swelling, SOB or lightheadedness with hypotension:No Has patient had a PCN reaction causing severe rash involving mucus membranes or skin necrosis:No Has patient had a PCN reaction that required hospitalization:No Has patient had a PCN reaction occurring within the last 10 years:No  If all of the above answers are NO, then may proceed with Cephalosporin use. Nausea & vomiting   Current Outpatient Medications on File Prior to Visit  Medication Sig Dispense Refill   lamoTRIgine (LAMICTAL) 200 MG tablet Take 200 mg by mouth daily.     norgestimate-ethinyl estradiol (ORTHO-CYCLEN) 0.25-35 MG-MCG tablet Take 1 tablet by mouth daily.     QUEtiapine (SEROQUEL) 50 MG tablet Take 50 mg by mouth at bedtime.     No current facility-administered medications on file prior to visit.        ROS:  All others reviewed and negative.  Objective        PE:  BP 106/72   Pulse 83   Temp (!) 97.5 F (  36.4 C) (Oral)   Ht 1' (0.305 m)   Wt 208 lb (94.3 kg)   SpO2 98%   BMI 1015.56 kg/m                 Constitutional: Pt appears in NAD               HENT: Head: NCAT.                Right Ear: External ear normal.                 Left Ear: External ear normal.                Eyes: . Pupils are equal, round, and reactive to light. Conjunctivae and EOM are normal               Nose: without d/c or deformity               Neck: Neck supple. Gross normal ROM               Cardiovascular: Normal rate and regular rhythm.                 Pulmonary/Chest: Effort normal and breath sounds without rales or wheezing.                Abd:  Soft, NT, ND, + BS, no organomegaly               Neurological: Pt is alert. At baseline  orientation, motor grossly intact               Skin: Skin is warm. No rashes, no other new lesions, LE edema - none               Psychiatric: Pt behavior is normal without agitation   Micro: none  Cardiac tracings I have personally interpreted today:  none  Pertinent Radiological findings (summarize): none   Lab Results  Component Value Date   WBC 10.8 (H) 06/10/2020   HGB 14.6 06/10/2020   HCT 42.6 06/10/2020   PLT 301 06/10/2020   GLUCOSE 120 (H) 06/10/2020   CHOL  02/20/2010    133        ATP III CLASSIFICATION:  <200     mg/dL   Desirable  799-760  mg/dL   Borderline High  >=759    mg/dL   High          TRIG 873 02/20/2010   HDL 50 02/20/2010   LDLCALC  02/20/2010    58        Total Cholesterol/HDL:CHD Risk Coronary Heart Disease Risk Table                     Men   Women  1/2 Average Risk   3.4   3.3  Average Risk       5.0   4.4  2 X Average Risk   9.6   7.1  3 X Average Risk  23.4   11.0        Use the calculated Patient Ratio above and the CHD Risk Table to determine the patient's CHD Risk.        ATP III CLASSIFICATION (LDL):  <100     mg/dL   Optimal  899-870  mg/dL   Near or Above  Optimal  130-159  mg/dL   Borderline  839-810  mg/dL   High  >809     mg/dL   Very High   ALT 19 93/93/7977   AST 26 06/10/2020   NA 138 06/10/2020   K 3.6 06/10/2020   CL 103 06/10/2020   CREATININE 0.90 06/10/2020   BUN 9 06/10/2020   CO2 24 06/10/2020   TSH 2.182 02/20/2010   HGBA1C  02/20/2010    5.6 (NOTE)                                                                       According to the ADA Clinical Practice Recommendations for 2011, when HbA1c is used as a screening test:   >=6.5%   Diagnostic of Diabetes Mellitus           (if abnormal result  is confirmed)  5.7-6.4%   Increased risk of developing Diabetes Mellitus  References:Diagnosis and Classification of Diabetes Mellitus,Diabetes Care,2011,34(Suppl 1):S62-S69 and Standards of Medical  Care in         Diabetes - 2011,Diabetes Care,2011,34  (Suppl 1):S11-S61.   Assessment/Plan:  Christie Smith is a 28 y.o. White or Caucasian [1] female with  has a past medical history of Anxiety, Asthma, Depression, and Genital herpes.  Encounter for well adult exam with abnormal findings Age and sex appropriate education and counseling updated with regular exercise and diet Referrals for preventative services - none needed Immunizations addressed - declines today Smoking counseling  - none needed Evidence for depression or other mood disorder - anxiety possible bipolar with very strong FH  - tx as such recently Most recent labs reviewed. I have personally reviewed and have noted: 1) the patient's medical and social history 2) The patient's current medications and supplements 3) The patient's height, weight, and BMI have been recorded in the chart   Recurrent UTI Likely intercourse related - for bactrim  1 every day prn intercourse, for UA with labs, pt requests urology referral.  Genital herpes D/w pt - ok to start suppressive tx - valtrex  500 qd  Anxiety And possible bipolar, cont current med tx, f/u psychiatry  Followup: Return in about 1 year (around 08/01/2024).  Lynwood Rush, MD 08/02/2023 8:38 PM [facility] - [facility]

## 2023-08-02 NOTE — Assessment & Plan Note (Signed)
 Age and sex appropriate education and counseling updated with regular exercise and diet Referrals for preventative services - none needed Immunizations addressed - declines today Smoking counseling  - none needed Evidence for depression or other mood disorder - anxiety possible bipolar with very strong FH  - tx as such recently Most recent labs reviewed. I have personally reviewed and have noted: 1) the patient's medical and social history 2) The patient's current medications and supplements 3) The patient's height, weight, and BMI have been recorded in the chart

## 2023-08-02 NOTE — Assessment & Plan Note (Signed)
 D/w pt - ok to start suppressive tx - valtrex  500 qd

## 2023-08-02 NOTE — Patient Instructions (Signed)
 Please take all new medication as prescribed- the suppressive valtrex  dosing  Please take all new medication as prescribed -- the Bactrim  1 per day as needed prn intercourse only  Please continue all other medications as before, and refills have been done if requested.  Please have the pharmacy call with any other refills you may need.  Please continue your efforts at being more active, low cholesterol diet, and weight control.  You are otherwise up to date with prevention measures today.  Please keep your appointments with your specialists as you may have planned  You will be contacted regarding the referral for: Urology  Please forward the Labs you had recently on Mychart  Please make an Appointment to return for your 1 year visit, or sooner if needed

## 2023-08-02 NOTE — Assessment & Plan Note (Signed)
 And possible bipolar, cont current med tx, f/u psychiatry

## 2023-08-02 NOTE — Assessment & Plan Note (Signed)
 Likely intercourse related - for bactrim  1 every day prn intercourse, for UA with labs, pt requests urology referral.

## 2023-08-27 ENCOUNTER — Ambulatory Visit: Admitting: Internal Medicine

## 2023-08-27 ENCOUNTER — Ambulatory Visit: Payer: Self-pay

## 2023-08-27 VITALS — BP 112/70 | HR 80 | Temp 98.4°F | Ht 68.0 in | Wt 212.0 lb

## 2023-08-27 DIAGNOSIS — R21 Rash and other nonspecific skin eruption: Secondary | ICD-10-CM | POA: Insufficient documentation

## 2023-08-27 DIAGNOSIS — L709 Acne, unspecified: Secondary | ICD-10-CM | POA: Diagnosis not present

## 2023-08-27 DIAGNOSIS — E7800 Pure hypercholesterolemia, unspecified: Secondary | ICD-10-CM

## 2023-08-27 DIAGNOSIS — E559 Vitamin D deficiency, unspecified: Secondary | ICD-10-CM | POA: Diagnosis not present

## 2023-08-27 DIAGNOSIS — N390 Urinary tract infection, site not specified: Secondary | ICD-10-CM

## 2023-08-27 DIAGNOSIS — E785 Hyperlipidemia, unspecified: Secondary | ICD-10-CM | POA: Insufficient documentation

## 2023-08-27 MED ORDER — KETOCONAZOLE 2 % EX CREA: 1.0000 | TOPICAL_CREAM | Freq: Every day | CUTANEOUS | 2 refills | Status: AC

## 2023-08-27 MED ORDER — FLUCONAZOLE 100 MG PO TABS: 100.0000 mg | ORAL_TABLET | Freq: Every day | ORAL | 0 refills | Status: AC

## 2023-08-27 MED ORDER — AZITHROMYCIN 250 MG PO TABS: ORAL_TABLET | ORAL | 1 refills | Status: AC

## 2023-08-27 NOTE — Assessment & Plan Note (Addendum)
 Improved it seems, Has appt now with urology late sept 2025 , to continue bactrim  prn

## 2023-08-27 NOTE — Patient Instructions (Signed)
 Please take all new medication as prescribed - the diflucan  and ketoconozole cr for the rash  Please take all new medication as prescribed - the antibiotic for the acne  Please take OTC Vitamin D3 at 2000 units per day, indefinitely  Please continue all other medications as before, and refills have been done if requested.  Please have the pharmacy call with any other refills you may need.  Please continue your efforts at being more active, low cholesterol diet, and weight control.  Please keep your appointments with your specialists as you may have planned - the urology appt in late Sept 2025  You will be contacted regarding the referral for: dermatology

## 2023-08-27 NOTE — Telephone Encounter (Signed)
 FYI Only or Action Required?: FYI only for provider.  Patient was last seen in primary care on 08/02/2023 by Norleen Lynwood ORN, MD.  Called Nurse Triage reporting Rash.  Symptoms began several weeks ago.  Interventions attempted: OTC medications: powder and diaper ointment.  Symptoms are: gradually worsening.  Triage Disposition: Call PCP Within 24 Hours  Patient/caregiver understands and will follow disposition?: Yes                 Copied from CRM #[id number]. Topic: Clinical - Red Word Triage >> Aug 27, 2023  9:00 AM Adelita E wrote: [facility] that prompted transfer to Nurse Triage: Rash. Patient is having a rash in her buttcrack that is red, itchy, and painful. Rash going on for 2-3 weeks. Reason for Disposition  [1] Applying cream or ointment AND [2] causes severe itch, burning or pain  Answer Assessment - Initial Assessment Questions 1. APPEARANCE of RASH: What does the rash look like? (e.g., blisters, dry flaky skin, red spots, redness, sores)     Red, itchy painful rash, dry 2. LOCATION: Where is the rash located?      Butt crack 3. NUMBER: How many spots are there?      1 big area 4. SIZE: How big are the spots? (e.g., inches, cm; or compare to size of pinhead, tip of pen, eraser, pea)      Top of behind to bottom 5. ONSET: When did the rash start?      2-3 weeks 6. ITCHING: Does the rash itch? If Yes, ask: How bad is the itch?  (Scale 0-10; or none, mild, moderate, severe)     yes 7. PAIN: Does the rash hurt? If Yes, ask: How bad is the pain?  (Scale 0-10; or none, mild, moderate, severe)     Yes - moderate 8. OTHER SYMPTOMS: Do you have any other symptoms? (e.g., fever)     yes 9. PREGNANCY: Is there any chance you are pregnant? When was your last menstrual period?     no  Protocols used: Rash or Redness - Localized-A-AH

## 2023-08-27 NOTE — Progress Notes (Signed)
 Patient ID: Christie Smith, female   DOB: [birth year], 28 y.o.   MRN: [id number]        Chief Complaint: follow up natal cleft and perianal intertrigo, low vit d, hld, acne        HPI:  Christie Smith is a 28 y.o. female here with c/o worsening fungal rash to natal cleft and perianal, now rubbed raw to an extent due to frequent cleaning but not otherwise improving. Also has acne form lesions to face recent onset for unclear reason though continues to gain wt.  No facial hair.  Pt denies chest pain, increased sob or doe, wheezing, orthopnea, PND, increased LE swelling, palpitations, dizziness or syncope.   Pt denies polydipsia, polyuria, or new focal neuro s/s.    Pt denies fever, wt loss, night sweats, loss of appetite, or other constitutional symptoms         Wt Readings from Last 3 Encounters:  08/27/23 212 lb (96.2 kg)  08/02/23 208 lb (94.3 kg)   BP Readings from Last 3 Encounters:  08/27/23 112/70  08/02/23 106/72  06/11/20 139/73         Past Medical History:  Diagnosis Date   Anxiety    Asthma    Depression    Genital herpes    History reviewed. No pertinent surgical history.  reports that she quit smoking about 13 years ago. Her smoking use included cigarettes. She does not have any smokeless tobacco history on file. She reports that she does not drink alcohol and does not use drugs. family history includes Bipolar disorder in her brother, mother, and sister. Allergies  Allergen Reactions   Amoxicillin-Pot Clavulanate Nausea And Vomiting and Rash    Has patient had a PCN reaction causing immediate rash, facial/tongue/throat swelling, SOB or lightheadedness with hypotension:No Has patient had a PCN reaction causing severe rash involving mucus membranes or skin necrosis:No Has patient had a PCN reaction that required hospitalization:No Has patient had a PCN reaction occurring within the last 10 years:No  If all of the above answers are NO, then may proceed with Cephalosporin  use. Nausea & vomiting   Current Outpatient Medications on File Prior to Visit  Medication Sig Dispense Refill   lamoTRIgine (LAMICTAL) 200 MG tablet Take 200 mg by mouth daily.     norgestimate-ethinyl estradiol (ORTHO-CYCLEN) 0.25-35 MG-MCG tablet Take 1 tablet by mouth daily.     QUEtiapine (SEROQUEL) 50 MG tablet Take 50 mg by mouth at bedtime.     sulfamethoxazole -trimethoprim  (BACTRIM  DS) 800-160 MG tablet Take 1 tablet by mouth daily as needed. After intercourse 30 tablet 1   valACYclovir  (VALTREX ) 500 MG tablet Take 1 tablet (500 mg total) by mouth daily. 90 tablet 3   No current facility-administered medications on file prior to visit.        ROS:  All others reviewed and negative.  Objective        PE:  BP 112/70   Pulse 80   Temp 98.4 F (36.9 C) (Oral)   Ht 5' 8 (1.727 m)   Wt 212 lb (96.2 kg)   SpO2 97%   BMI 32.23 kg/m                 Constitutional: Pt appears in NAD               HENT: Head: NCAT.                Right Ear: External ear normal.  Left Ear: External ear normal.                Eyes: . Pupils are equal, round, and reactive to light. Conjunctivae and EOM are normal               Nose: without d/c or deformity               Neck: Neck supple. Gross normal ROM               Cardiovascular: Normal rate and regular rhythm.                 Pulmonary/Chest: Effort normal and breath sounds without rales or wheezing.                Abd:  Soft, NT, ND, + BS, no organomegaly               Neurological: Pt is alert. At baseline orientation, motor grossly intact               Skin: Skin is warm. LE edema - none               Psychiatric: Pt behavior is normal without agitation   Micro: none  Cardiac tracings I have personally interpreted today:  none  Pertinent Radiological findings (summarize): none   Lab Results  Component Value Date   WBC 10.8 (H) 06/10/2020   HGB 14.6 06/10/2020   HCT 42.6 06/10/2020   PLT 301 06/10/2020   GLUCOSE  120 (H) 06/10/2020   CHOL  02/20/2010    133        ATP III CLASSIFICATION:  <200     mg/dL   Desirable  799-760  mg/dL   Borderline High  >=759    mg/dL   High          TRIG 873 02/20/2010   HDL 50 02/20/2010   LDLCALC  02/20/2010    58        Total Cholesterol/HDL:CHD Risk Coronary Heart Disease Risk Table                     Men   Women  1/2 Average Risk   3.4   3.3  Average Risk       5.0   4.4  2 X Average Risk   9.6   7.1  3 X Average Risk  23.4   11.0        Use the calculated Patient Ratio above and the CHD Risk Table to determine the patient's CHD Risk.        ATP III CLASSIFICATION (LDL):  <100     mg/dL   Optimal  899-870  mg/dL   Near or Above                    Optimal  130-159  mg/dL   Borderline  839-810  mg/dL   High  >809     mg/dL   Very High   ALT 19 93/93/7977   AST 26 06/10/2020   NA 138 06/10/2020   K 3.6 06/10/2020   CL 103 06/10/2020   CREATININE 0.90 06/10/2020   BUN 9 06/10/2020   CO2 24 06/10/2020   TSH 2.182 02/20/2010   HGBA1C  02/20/2010    5.6 (NOTE)  According to the ADA Clinical Practice Recommendations for 2011, when HbA1c is used as a screening test:   >=6.5%   Diagnostic of Diabetes Mellitus           (if abnormal result  is confirmed)  5.7-6.4%   Increased risk of developing Diabetes Mellitus  References:Diagnosis and Classification of Diabetes Mellitus,Diabetes Care,2011,34(Suppl 1):S62-S69 and Standards of Medical Care in         Diabetes - 2011,Diabetes Care,2011,34  (Suppl 1):S11-S61.   Assessment/Plan:  Christie Smith is a 28 y.o. White or Caucasian [1] female with  has a past medical history of Anxiety, Asthma, Depression, and Genital herpes.  Recurrent UTI Improved it seems, Has appt now with urology late sept 2025 , to continue bactrim  prn  HLD (hyperlipidemia) Lab Results  Component Value Date   [facility]  02/20/2010    58        Total  Cholesterol/HDL:CHD Risk Coronary Heart Disease Risk Table                     Men   Women  1/2 Average Risk   3.4   3.3  Average Risk       5.0   4.4  2 X Average Risk   9.6   7.1  3 X Average Risk  23.4   11.0        Use the calculated Patient Ratio above and the CHD Risk Table to determine the patient's CHD Risk.        ATP III CLASSIFICATION (LDL):  <100     mg/dL   Optimal  899-870  mg/dL   Near or Above                    Optimal  130-159  mg/dL   Borderline  839-810  mg/dL   High  >809     mg/dL   Very High   Unocntrolled with LDL 123,  pt for lower chol diet, declines statin   Rash Worsening intertrigo - for diflucan  100 every day x 7, also ketoconozole cream asd  Vitamin D deficiency Last vitamin D Noted on recent labs per GYN -  Low, to start oral replacement   Acne With recent worsening, pt for zpack x 1, also refer dermatology per pt reqeust  Followup: Return if symptoms worsen or fail to improve.  Lynwood Rush, MD 08/28/2023 3:08 PM [facility] - [facility] Internal Medicine

## 2023-08-28 ENCOUNTER — Encounter: Payer: Self-pay | Admitting: Internal Medicine

## 2023-08-28 DIAGNOSIS — L709 Acne, unspecified: Secondary | ICD-10-CM | POA: Insufficient documentation

## 2023-08-28 NOTE — Assessment & Plan Note (Signed)
 Lab Results  Component Value Date   [facility]  02/20/2010    58        Total Cholesterol/HDL:CHD Risk Coronary Heart Disease Risk Table                     Men   Women  1/2 Average Risk   3.4   3.3  Average Risk       5.0   4.4  2 X Average Risk   9.6   7.1  3 X Average Risk  23.4   11.0        Use the calculated Patient Ratio above and the CHD Risk Table to determine the patient's CHD Risk.        ATP III CLASSIFICATION (LDL):  <100     mg/dL   Optimal  899-870  mg/dL   Near or Above                    Optimal  130-159  mg/dL   Borderline  839-810  mg/dL   High  >809     mg/dL   Very High   Unocntrolled with LDL 123,  pt for lower chol diet, declines statin

## 2023-08-28 NOTE — Assessment & Plan Note (Signed)
 Worsening intertrigo - for diflucan  100 every day x 7, also ketoconozole cream asd

## 2023-08-28 NOTE — Assessment & Plan Note (Signed)
 With recent worsening, pt for zpack x 1, also refer dermatology per pt reqeust

## 2023-08-28 NOTE — Assessment & Plan Note (Signed)
 Last vitamin D Noted on recent labs per GYN -  Low, to start oral replacement

## 2023-12-13 ENCOUNTER — Ambulatory Visit: Payer: Self-pay

## 2023-12-13 NOTE — Telephone Encounter (Signed)
 FYI Only or Action Required?: FYI only for provider: appointment scheduled on 12/10.  Patient was last seen in primary care on 08/27/2023 by Norleen Lynwood ORN, MD.  Called Nurse Triage reporting Shoulder Pain.  Symptoms began several months ago.  Interventions attempted: Rest, hydration, or home remedies and Ice/heat application.  Symptoms are: gradually worsening.  Triage Disposition: See PCP When Office is Open (Within 3 Days)  Patient/caregiver understands and will follow disposition?: Yes  Copied from CRM #[id number]. Topic: Clinical - Red Word Triage >> Dec 13, 2023 10:58 AM Christie Smith wrote: Red Word that prompted transfer to Nurse Triage: Left shoulder pain that is getting worse and pain is shooting up to neck, feels like its on bone, feels out of socket, can barley lift without having severe pain. Hears a popping noise. 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 .  Bone on Bone= top of left shoulder- massage - not the muscle but the bone  AC joint to the collar bone and some pain the the left neck muscle.  Hard to lift arm up- painful, feels like it sits lower. Denies weakness, numbness tingling, swelling, rash or red streaking. Denies CP, SOB, Dizziness. Not on Birth control.  Popping noise like out of socket heating and ice- mild relief Reviewed at length ED precautions with any change or worsening of symptoms and understood. She cannot be seen 12/9, scheduled 12/10 with PCP. Denies wanting to be seen in the ED or UC more urgently.   1. ONSET: When did the pain start?     September - but worsened over the last 2 months  2. LOCATION: Where is the pain located?     Left top of the shoulder 3. PAIN: How bad is the pain? (Scale 1-10; or mild, moderate,  severe)     8-11/10 4. WORK OR EXERCISE: Has there been any recent work or exercise that involved this part of the body?     Professional cleaner- is right handed  5. CAUSE: What do you think is causing the shoulder pain?     Repetitive stress injury  6. OTHER SYMPTOMS: Do you have any other symptoms? (e.g., neck pain, swelling, rash, fever, numbness, weakness)     Denies numb, tingling, rash, fever,  7. PREGNANCY: Is there any chance you are pregnant? When was your last menstrual period?     Denies  Protocols used: Shoulder Pain-A-AH

## 2023-12-15 ENCOUNTER — Ambulatory Visit: Admitting: Internal Medicine

## 2023-12-15 ENCOUNTER — Ambulatory Visit

## 2023-12-15 ENCOUNTER — Encounter: Payer: Self-pay | Admitting: Internal Medicine

## 2023-12-15 VITALS — BP 120/82 | HR 85 | Temp 97.9°F | Ht 68.0 in | Wt 228.0 lb

## 2023-12-15 DIAGNOSIS — Z7251 High risk heterosexual behavior: Secondary | ICD-10-CM | POA: Insufficient documentation

## 2023-12-15 DIAGNOSIS — K5900 Constipation, unspecified: Secondary | ICD-10-CM | POA: Insufficient documentation

## 2023-12-15 DIAGNOSIS — G8929 Other chronic pain: Secondary | ICD-10-CM

## 2023-12-15 DIAGNOSIS — M25512 Pain in left shoulder: Secondary | ICD-10-CM

## 2023-12-15 DIAGNOSIS — N898 Other specified noninflammatory disorders of vagina: Secondary | ICD-10-CM | POA: Insufficient documentation

## 2023-12-15 DIAGNOSIS — N939 Abnormal uterine and vaginal bleeding, unspecified: Secondary | ICD-10-CM | POA: Insufficient documentation

## 2023-12-15 DIAGNOSIS — R569 Unspecified convulsions: Secondary | ICD-10-CM | POA: Insufficient documentation

## 2023-12-15 DIAGNOSIS — F1210 Cannabis abuse, uncomplicated: Secondary | ICD-10-CM | POA: Insufficient documentation

## 2023-12-15 DIAGNOSIS — R110 Nausea: Secondary | ICD-10-CM | POA: Insufficient documentation

## 2023-12-15 MED ORDER — TRAMADOL HCL 50 MG PO TABS: 50.0000 mg | ORAL_TABLET | Freq: Four times a day (QID) | ORAL | 0 refills | Status: AC | PRN

## 2023-12-15 NOTE — Patient Instructions (Signed)
 Please take all new medication as prescribed - the tramadol as needed for pain  Ok to also try the OTC Voltaren gel as needed to the [facility] joint area  Please continue all other medications as before, and refills have been done if requested.  Please have the pharmacy call with any other refills you may need.  Please keep your appointments with your specialists as you may have planned  You will be contacted regarding the referral for: Sports Medicine (though you could also stop at the first floor desk for an appt as well)  Please go to the XRAY Department in the first floor for the x-ray testing  You will be contacted by phone if any changes need to be made immediately.  Otherwise, you will receive a letter about your results with an explanation, but please check with MyChart first.

## 2023-12-15 NOTE — Progress Notes (Signed)
 Patient ID: Christie Smith, female   DOB: March 01, 1995, 28 y.o.   MRN: [id number]        Chief Complaint: follow up left shoulder pain       HPI:  Christie Smith is a 28 y.o. female here with c/o 3 mo persistent left shoulder pain primarily localized and tender at the left [facility] joint.  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 LBP resolved, but shoulder pain still 8/10.    No falls or trauma.  Pt denies chest pain, increased sob or doe, wheezing, orthopnea, PND, increased LE swelling, palpitations, dizziness or syncope.   Pt denies polydipsia, polyuria, or new focal neuro s/s.          Wt Readings from Last 3 Encounters:  12/15/23 228 lb (103.4 kg)  08/27/23 212 lb (96.2 kg)  08/02/23 208 lb (94.3 kg)   BP Readings from Last 3 Encounters:  12/15/23 120/82  08/27/23 112/70  08/02/23 106/72         Past Medical History:  Diagnosis Date   Anxiety    Asthma    Depression    Genital herpes    History reviewed. No pertinent surgical history.  reports that she quit smoking about 13 years ago. Her smoking use included cigarettes. She does not have any smokeless tobacco history on file. She reports that she does not drink alcohol and does not use drugs. family history includes Bipolar disorder in her brother, mother, and sister. Allergies  Allergen Reactions   Amoxicillin-Pot Clavulanate Nausea And Vomiting and Rash    Has patient had a PCN reaction causing immediate rash, facial/tongue/throat swelling, SOB or lightheadedness with hypotension:No Has patient had a PCN reaction causing severe rash involving mucus membranes or skin necrosis:No Has patient had a PCN reaction that required hospitalization:No Has patient had a PCN reaction occurring within the last 10 years:No  If all of the above answers are NO, then may proceed with Cephalosporin use. Nausea & vomiting   Current Outpatient Medications  on File Prior to Visit  Medication Sig Dispense Refill   fluconazole  (DIFLUCAN ) 100 MG tablet Take 1 tablet (100 mg total) by mouth daily. 7 tablet 0   ketoconazole  (NIZORAL ) 2 % cream Apply 1 Application topically daily. 30 g 2   lamoTRIgine (LAMICTAL) 200 MG tablet Take 200 mg by mouth daily.     norgestimate-ethinyl estradiol (ORTHO-CYCLEN) 0.25-35 MG-MCG tablet Take 1 tablet by mouth daily.     QUEtiapine (SEROQUEL) 50 MG tablet Take 50 mg by mouth at bedtime.     sulfamethoxazole -trimethoprim  (BACTRIM  DS) 800-160 MG tablet Take 1 tablet by mouth daily as needed. After intercourse 30 tablet 1   valACYclovir  (VALTREX ) 500 MG tablet Take 1 tablet (500 mg total) by mouth daily. 90 tablet 3   No current facility-administered medications on file prior to visit.        ROS:  All others reviewed and negative.  Objective        PE:  BP 120/82 (BP Location: Left Arm, Patient Position: Sitting, Cuff Size: Normal)   Pulse 85   Temp 97.9 F (36.6 C) (Oral)   Ht 5' 8 (1.727 m)   Wt 228 lb (103.4 kg)   LMP 11/22/2023 (Exact Date)   SpO2 99%   BMI 34.67 kg/m                 Constitutional: Pt appears  in NAD               HENT: Head: NCAT.                Right Ear: External ear normal.                 Left Ear: External ear normal.                Eyes: . Pupils are equal, round, and reactive to light. Conjunctivae and EOM are normal               Nose: without d/c or deformity               Neck: Neck supple. Gross normal ROM               Cardiovascular: Normal rate and regular rhythm.                 Pulmonary/Chest: Effort normal and breath sounds without rales or wheezing.                Abd:  Soft, NT, ND, + BS, no organomegaly               Neurological: Pt is alert. At baseline orientation, motor grossly intact; tender swelling at left [facility] joint               Skin: Skin is warm. No rashes, no other new lesions, LE edema - none               Psychiatric: Pt behavior is normal without  agitation   Micro: none  Cardiac tracings I have personally interpreted today:  none  Pertinent Radiological findings (summarize): none   Lab Results  Component Value Date   WBC 10.8 (H) 06/10/2020   HGB 14.6 06/10/2020   HCT 42.6 06/10/2020   PLT 301 06/10/2020   GLUCOSE 120 (H) 06/10/2020   CHOL  02/20/2010    133        ATP III CLASSIFICATION:  <200     mg/dL   Desirable  799-760  mg/dL   Borderline High  >=759    mg/dL   High          TRIG 873 02/20/2010   HDL 50 02/20/2010   LDLCALC  02/20/2010    58        Total Cholesterol/HDL:CHD Risk Coronary Heart Disease Risk Table                     Men   Women  1/2 Average Risk   3.4   3.3  Average Risk       5.0   4.4  2 X Average Risk   9.6   7.1  3 X Average Risk  23.4   11.0        Use the calculated Patient Ratio above and the CHD Risk Table to determine the patient's CHD Risk.        ATP III CLASSIFICATION (LDL):  <100     mg/dL   Optimal  899-870  mg/dL   Near or Above                    Optimal  130-159  mg/dL   Borderline  839-810  mg/dL   High  >809     mg/dL   Very High   ALT 19 93/93/7977   AST 26 06/10/2020  NA 138 06/10/2020   K 3.6 06/10/2020   CL 103 06/10/2020   CREATININE 0.90 06/10/2020   BUN 9 06/10/2020   CO2 24 06/10/2020   TSH 2.182 02/20/2010   HGBA1C  02/20/2010    5.6 (NOTE)                                                                       According to the ADA Clinical Practice Recommendations for 2011, when HbA1c is used as a screening test:   >=6.5%   Diagnostic of Diabetes Mellitus           (if abnormal result  is confirmed)  5.7-6.4%   Increased risk of developing Diabetes Mellitus  References:Diagnosis and Classification of Diabetes Mellitus,Diabetes Care,2011,34(Suppl 1):S62-S69 and Standards of Medical Care in         Diabetes - 2011,Diabetes Care,2011,34  (Suppl 1):S11-S61.   Assessment/Plan:  Christie Smith is a 28 y.o. White or Caucasian [1] female with  has a past  medical history of Anxiety, Asthma, Depression, and Genital herpes.  Chronic left shoulder pain Now 3 mo persistent mod to severe, likely AC joint arthritis flare it seems, but for right shoulder xray, volt gel topical prn, tramadol limited rx prn use, and refer sports medicine for possible cortisone  Followup: Return if symptoms worsen or fail to improve.  Lynwood Rush, MD 12/15/2023 9:10 PM [facility]

## 2023-12-15 NOTE — Assessment & Plan Note (Signed)
 Now 3 mo persistent mod to severe, likely AC joint arthritis flare it seems, but for right shoulder xray, volt gel topical prn, tramadol limited rx prn use, and refer sports medicine for possible cortisone

## 2023-12-20 NOTE — Progress Notes (Unsigned)
 Ben Jackson D[facility] [address] [place] [address] Phone: [phone]   Assessment and Plan:     1. Subacromial bursitis of left shoulder joint (Primary) 2. Pain in left acromioclavicular joint 3. Chronic left shoulder pain - Chronic with exacerbation, initial sports medicine visit - At this time, patient's pain appears to be multifactorial including subacromial bursitis, AC joint pain.  Likely flared by patient's physical activity at work - Reviewed patient's x-ray.  My interpretation: No acute fracture or dislocation. - Start meloxicam  15 mg daily x2 weeks.  If still having pain after 2 weeks, complete 3rd-week of NSAID. May use remaining NSAID as needed once daily for pain control.  Do not to use additional over-the-counter NSAIDs (ibuprofen, naproxen, Advil, Aleve, etc.) while taking prescription NSAIDs.  May use Tylenol  [phone] mg 2 to 3 times a day for breakthrough pain. - Start HEP for rotator cuff   15 additional minutes spent for educating Therapeutic Home Exercise Program.  This included exercises focusing on stretching, strengthening, with focus on eccentric aspects.   Long term goals include an improvement in range of motion, strength, endurance as well as avoiding reinjury. Patient's frequency would include in 1-2 times a day, 3-5 times a week for a duration of 6-12 weeks. Proper technique shown and discussed handout in great detail with ATC.  All questions were discussed and answered.    Pertinent previous records reviewed include left shoulder x-ray 12/15/2023   Follow Up: 4 to 6 weeks for reevaluation.  Could consider physical therapy versus CSI   Subjective:   I, Aunna Snooks, am serving as a neurosurgeon for Doctor Morene Mace  Chief Complaint: left shoulder pain   HPI:   12/21/2023 Patient is a 28 year old female with left shoulder pain. Patient states pain started September. She was working as a financial trader.  Decreased ROM. Pain feels like its on top of the bone. Pain radiates to the collar bone. States it feels out of its socket. Ibu for the pain and that helps. No numbness or tingling. Grip strength intact. Has been doing RICES. Dr, Devora prescribed tramadol  but she did not use    Relevant Historical Information:  none pertinent   Additional pertinent review of systems negative.  Current Medications[1]   Objective:     Vitals:   12/21/23 1319  Pulse: (!) 108  SpO2: 98%  Weight: 227 lb (103 kg)  Height: 5' 7 (1.702 m)      Body mass index is 35.55 kg/m.    Physical Exam:    Gen: Appears well, nad, nontoxic and pleasant Neuro:sensation intact, strength is 5/5, muscle tone wnl Skin: no suspicious lesion or defmority Psych: A&O, appropriate mood and affect  Left shoulder:  No deformity, swelling or muscle wasting No scapular winging FF 180, abd 180, int 0, ext 90  TTP over the   clavicle, ac, coracoid, biceps groove, humerus, deltoid, trapezius, NTTP [place], cervical spine Positive neer, hawkins, empty can, obriens, crossarm, subscap liftoff, Negative speeds Neg ant drawer, sulcus sign, apprehension Negative Spurling's test bilat FROM of neck   Electronically signed by:  Odis Mace [facility] 1:39 PM 12/21/2023     [1]  Current Outpatient Medications:    fluconazole  (DIFLUCAN ) 100 MG tablet, Take 1 tablet (100 mg total) by mouth daily., Disp: 7 tablet, Rfl: 0   ketoconazole  (NIZORAL ) 2 % cream, Apply 1 Application topically daily., Disp: 30 g, Rfl: 2  lamoTRIgine (LAMICTAL) 200 MG tablet, Take 200 mg by mouth daily., Disp: , Rfl:    meloxicam  (MOBIC ) 15 MG tablet, Take 1 tablet (15 mg total) by mouth daily., Disp: 30 tablet, Rfl: 0   norgestimate-ethinyl estradiol (ORTHO-CYCLEN) 0.25-35 MG-MCG tablet, Take 1 tablet by mouth daily., Disp: , Rfl:    QUEtiapine (SEROQUEL) 50 MG tablet, Take 50 mg by mouth at bedtime., Disp: , Rfl:     sulfamethoxazole -trimethoprim  (BACTRIM  DS) 800-160 MG tablet, Take 1 tablet by mouth daily as needed. After intercourse, Disp: 30 tablet, Rfl: 1   valACYclovir  (VALTREX ) 500 MG tablet, Take 1 tablet (500 mg total) by mouth daily., Disp: 90 tablet, Rfl: 3   traMADol  (ULTRAM ) 50 MG tablet, Take 1 tablet (50 mg total) by mouth every 6 (six) hours as needed., Disp: 30 tablet, Rfl: 0

## 2023-12-21 ENCOUNTER — Ambulatory Visit: Admitting: Sports Medicine

## 2023-12-21 ENCOUNTER — Ambulatory Visit: Payer: Self-pay | Admitting: Internal Medicine

## 2023-12-21 VITALS — HR 108 | Ht 67.0 in | Wt 227.0 lb

## 2023-12-21 DIAGNOSIS — M7552 Bursitis of left shoulder: Secondary | ICD-10-CM

## 2023-12-21 DIAGNOSIS — G8929 Other chronic pain: Secondary | ICD-10-CM

## 2023-12-21 DIAGNOSIS — M25512 Pain in left shoulder: Secondary | ICD-10-CM

## 2023-12-21 MED ORDER — MELOXICAM 15 MG PO TABS: 15.0000 mg | ORAL_TABLET | Freq: Every day | ORAL | 0 refills | Status: AC

## 2023-12-21 NOTE — Patient Instructions (Signed)
-   Start meloxicam 15 mg daily x2 weeks.  If still having pain after 2 weeks, complete 3rd-week of NSAID. May use remaining NSAID as needed once daily for pain control.  Do not to use additional over-the-counter NSAIDs (ibuprofen, naproxen , Advil, Aleve , etc.) while taking prescription NSAIDs.  May use Tylenol  [phone] mg 2 to 3 times a day for breakthrough pain.  Shoulder HEP  4-6 week follow up

## 2024-01-17 NOTE — Progress Notes (Unsigned)
 "               Odis Mace D[facility] [address] [place] [address] Phone: [phone]   Assessment and Plan:     1. Chronic left shoulder pain (Primary) 2. Subacromial bursitis of left shoulder joint 3. Pain in left acromioclavicular joint -Chronic with exacerbation, subsequent visit - Overall patient's pain has improved with meloxicam  course, relative rest, HEP.  Still having lateral shoulder soreness most consistent with irritation of rotator cuff at greater tuberosity insertion - Use meloxicam  15 mg daily as needed for breakthrough pain.  Recommend limiting chronic NSAIDs to 1-2 doses per week to prevent long-term side effects. Use Tylenol  500 to 1000 mg tablets 2-3 times a day as needed for day-to-day pain relief.    - Recommend topical Voltaren gel over areas of pain - Continue HEP and start physical therapy.  Referral sent  Pertinent previous records reviewed include none   Follow Up: 4 to 6 weeks for reevaluation.  Could consider CSI versus advanced imaging with ultrasound/MRI   Subjective:   I, Israella Hubert, am serving as a neurosurgeon for Doctor Morene Mace   Chief Complaint: left shoulder pain    HPI:    12/21/2023 Patient is a 29 year old female with left shoulder pain. Patient states pain started September. She was working as a financial trader. Decreased ROM. Pain feels like its on top of the bone. Pain radiates to the collar bone. States it feels out of its socket. Ibu for the pain and that helps. No numbness or tingling. Grip strength intact. Has been doing RICES. Dr, Devora prescribed tramadol  but she did not use    01/18/2024 Patient states she is getting better. Pain is now just dull    Relevant Historical Information:  none pertinent   Additional pertinent review of systems negative.  Current Medications[1]   Objective:     Vitals:   01/18/24 1519  Pulse: 92  SpO2: 97%  Weight: 227 lb (103 kg)  Height: 5' 7 (1.702 m)       Body mass index is 35.55 kg/m.    Physical Exam:    Gen: Appears well, nad, nontoxic and pleasant Neuro:sensation intact, strength is 5/5, muscle tone wnl Skin: no suspicious lesion or defmority Psych: A&O, appropriate mood and affect   Left shoulder:  No deformity, swelling or muscle wasting No scapular winging FF 180, abd 180, int 0, ext 90  TTP over the   deltoid  NTTP [place], cervical spine Positive neer, hawkins, empty can, obriens, crossarm, subscap liftoff, Negative speeds Neg ant drawer, sulcus sign, apprehension Negative Spurling's test bilat FROM of neck    Electronically signed by:  Odis Mace [facility] 4:26 PM 01/18/2024     [1]  Current Outpatient Medications:    meloxicam  (MOBIC ) 15 MG tablet, Take 1 tablet (15 mg total) by mouth daily as needed for pain., Disp: 30 tablet, Rfl: 0   fluconazole  (DIFLUCAN ) 100 MG tablet, Take 1 tablet (100 mg total) by mouth daily., Disp: 7 tablet, Rfl: 0   ketoconazole  (NIZORAL ) 2 % cream, Apply 1 Application topically daily., Disp: 30 g, Rfl: 2   lamoTRIgine (LAMICTAL) 200 MG tablet, Take 200 mg by mouth daily., Disp: , Rfl:    meloxicam  (MOBIC ) 15 MG tablet, Take 1 tablet (15 mg total) by mouth daily., Disp: 30 tablet, Rfl: 0   norgestimate-ethinyl estradiol (ORTHO-CYCLEN) 0.25-35 MG-MCG tablet, Take 1 tablet by  mouth daily., Disp: , Rfl:    QUEtiapine (SEROQUEL) 50 MG tablet, Take 50 mg by mouth at bedtime., Disp: , Rfl:    sulfamethoxazole -trimethoprim  (BACTRIM  DS) 800-160 MG tablet, Take 1 tablet by mouth daily as needed. After intercourse, Disp: 30 tablet, Rfl: 1   traMADol  (ULTRAM ) 50 MG tablet, Take 1 tablet (50 mg total) by mouth every 6 (six) hours as needed., Disp: 30 tablet, Rfl: 0   valACYclovir  (VALTREX ) 500 MG tablet, Take 1 tablet (500 mg total) by mouth daily., Disp: 90 tablet, Rfl: 3  "

## 2024-01-18 ENCOUNTER — Ambulatory Visit: Admitting: Sports Medicine

## 2024-01-18 VITALS — HR 92 | Ht 67.0 in | Wt 227.0 lb

## 2024-01-18 DIAGNOSIS — M25512 Pain in left shoulder: Secondary | ICD-10-CM

## 2024-01-18 DIAGNOSIS — M7552 Bursitis of left shoulder: Secondary | ICD-10-CM | POA: Diagnosis not present

## 2024-01-18 DIAGNOSIS — G8929 Other chronic pain: Secondary | ICD-10-CM

## 2024-01-18 MED ORDER — MELOXICAM 15 MG PO TABS: 15.0000 mg | ORAL_TABLET | Freq: Every day | ORAL | 0 refills | Status: AC | PRN

## 2024-01-18 NOTE — Patient Instructions (Addendum)
-   Use meloxicam  15 mg daily as needed for breakthrough pain.  Recommend limiting chronic NSAIDs to 1-2 doses per week to prevent long-term side effects. Use Tylenol  500 to 1000 mg tablets 2-3 times a day as needed for day-to-day pain relief.    Refill meloxicam    Continue HEP   PT referral   Voltaren gel over areas of pain   4 week follow up

## 2024-02-15 ENCOUNTER — Ambulatory Visit: Admitting: Sports Medicine
